# Patient Record
Sex: Male | Born: 1975 | Race: Black or African American | Hispanic: No | Marital: Married | Smoking: Current every day smoker
Health system: Southern US, Community
[De-identification: ages and names within clinical notes are randomized; demographics above are authoritative.]

## PROBLEM LIST (undated history)

## (undated) DIAGNOSIS — E7800 Pure hypercholesterolemia, unspecified: Secondary | ICD-10-CM

## (undated) HISTORY — PX: COLONOSCOPY: SHX174

## (undated) HISTORY — PX: OTHER SURGICAL HISTORY: SHX169

---

## 2004-03-20 ENCOUNTER — Emergency Department: Payer: Self-pay | Admitting: Emergency Medicine

## 2004-04-25 ENCOUNTER — Emergency Department: Payer: Self-pay | Admitting: Emergency Medicine

## 2004-05-11 ENCOUNTER — Emergency Department: Payer: Self-pay | Admitting: Emergency Medicine

## 2004-12-28 ENCOUNTER — Emergency Department: Payer: Self-pay | Admitting: Emergency Medicine

## 2005-08-27 ENCOUNTER — Emergency Department: Payer: Self-pay | Admitting: Emergency Medicine

## 2005-09-12 ENCOUNTER — Emergency Department: Payer: Self-pay | Admitting: Emergency Medicine

## 2006-08-23 ENCOUNTER — Emergency Department: Payer: Self-pay | Admitting: Emergency Medicine

## 2007-06-02 ENCOUNTER — Emergency Department: Payer: Self-pay | Admitting: Emergency Medicine

## 2009-01-02 ENCOUNTER — Emergency Department: Payer: Self-pay | Admitting: Emergency Medicine

## 2010-02-27 ENCOUNTER — Ambulatory Visit: Payer: Self-pay | Admitting: General Surgery

## 2010-03-12 ENCOUNTER — Ambulatory Visit: Payer: Self-pay | Admitting: Cardiovascular Disease

## 2010-03-13 ENCOUNTER — Ambulatory Visit: Payer: Self-pay | Admitting: General Surgery

## 2010-03-17 LAB — PATHOLOGY REPORT

## 2010-12-30 ENCOUNTER — Emergency Department: Payer: Self-pay

## 2011-05-13 ENCOUNTER — Ambulatory Visit: Payer: Self-pay

## 2011-06-16 ENCOUNTER — Ambulatory Visit: Payer: Self-pay

## 2011-11-12 ENCOUNTER — Emergency Department: Payer: Self-pay | Admitting: Unknown Physician Specialty

## 2011-11-12 LAB — URINALYSIS, COMPLETE
Bilirubin,UR: NEGATIVE
Glucose,UR: NEGATIVE mg/dL (ref 0–75)
Leukocyte Esterase: NEGATIVE
Ph: 6 (ref 4.5–8.0)
Protein: NEGATIVE
RBC,UR: 1 /HPF (ref 0–5)
Specific Gravity: 1.003 (ref 1.003–1.030)

## 2011-11-12 LAB — CBC
HGB: 13.7 g/dL (ref 13.0–18.0)
MCH: 30.8 pg (ref 26.0–34.0)
MCHC: 32.9 g/dL (ref 32.0–36.0)
MCV: 93 fL (ref 80–100)
RBC: 4.44 10*6/uL (ref 4.40–5.90)
RDW: 14.5 % (ref 11.5–14.5)
WBC: 5.1 10*3/uL (ref 3.8–10.6)

## 2011-11-12 LAB — COMPREHENSIVE METABOLIC PANEL
Albumin: 3.8 g/dL (ref 3.4–5.0)
Alkaline Phosphatase: 76 U/L (ref 50–136)
Anion Gap: 7 (ref 7–16)
BUN: 5 mg/dL — ABNORMAL LOW (ref 7–18)
Bilirubin,Total: 0.9 mg/dL (ref 0.2–1.0)
Calcium, Total: 9.1 mg/dL (ref 8.5–10.1)
Co2: 31 mmol/L (ref 21–32)
Glucose: 108 mg/dL — ABNORMAL HIGH (ref 65–99)
Osmolality: 279 (ref 275–301)
Potassium: 3.6 mmol/L (ref 3.5–5.1)
SGPT (ALT): 20 U/L
Total Protein: 8.1 g/dL (ref 6.4–8.2)

## 2011-11-12 LAB — LIPASE, BLOOD: Lipase: 136 U/L (ref 73–393)

## 2014-03-23 ENCOUNTER — Emergency Department: Payer: Self-pay | Admitting: Internal Medicine

## 2015-11-24 ENCOUNTER — Emergency Department: Payer: Self-pay

## 2015-11-24 ENCOUNTER — Emergency Department
Admission: EM | Admit: 2015-11-24 | Discharge: 2015-11-24 | Disposition: A | Discharge status: Home / Self Care | Payer: Self-pay | Attending: Emergency Medicine | Admitting: Emergency Medicine

## 2015-11-24 ENCOUNTER — Encounter: Payer: Self-pay | Admitting: Emergency Medicine

## 2015-11-24 DIAGNOSIS — X501XXA Overexertion from prolonged static or awkward postures, initial encounter: Secondary | ICD-10-CM | POA: Insufficient documentation

## 2015-11-24 DIAGNOSIS — Y9301 Activity, walking, marching and hiking: Secondary | ICD-10-CM | POA: Insufficient documentation

## 2015-11-24 DIAGNOSIS — Y92007 Garden or yard of unspecified non-institutional (private) residence as the place of occurrence of the external cause: Secondary | ICD-10-CM | POA: Insufficient documentation

## 2015-11-24 DIAGNOSIS — F17210 Nicotine dependence, cigarettes, uncomplicated: Secondary | ICD-10-CM | POA: Insufficient documentation

## 2015-11-24 DIAGNOSIS — Y999 Unspecified external cause status: Secondary | ICD-10-CM | POA: Insufficient documentation

## 2015-11-24 DIAGNOSIS — S93402A Sprain of unspecified ligament of left ankle, initial encounter: Secondary | ICD-10-CM | POA: Insufficient documentation

## 2015-11-24 DIAGNOSIS — M25562 Pain in left knee: Secondary | ICD-10-CM | POA: Insufficient documentation

## 2015-11-24 DIAGNOSIS — G8911 Acute pain due to trauma: Secondary | ICD-10-CM

## 2015-11-24 MED ORDER — NAPROXEN 500 MG PO TABS: 500.0000 mg | ORAL_TABLET | Freq: Two times a day (BID) | ORAL | Status: AC

## 2015-11-24 MED ORDER — TRAMADOL HCL 50 MG PO TABS: 50.0000 mg | ORAL_TABLET | Freq: Four times a day (QID) | ORAL | Status: DC | PRN

## 2015-11-24 MED ORDER — TRAMADOL HCL 50 MG PO TABS
50.0000 mg | ORAL_TABLET | Freq: Once | ORAL | Status: AC
  Administered 2015-11-24: 50 mg via ORAL
  Filled 2015-11-24: qty 1

## 2015-11-24 NOTE — ED Notes (Signed)
See triage note.

## 2015-11-24 NOTE — ED Provider Notes (Signed)
[facility] Emergency Department Provider Note ____________________________________________  Time seen: Approximately 8:09 PM  I have reviewed the triage vital signs and the nursing notes.   HISTORY  Chief Complaint Ankle Injury    HPI Cody Aguilar is a 40 y.o. male who presents to the emergency department for evaluation of left ankle pain. He states while walking in his yard yesterday, he stepped in a hole and twisted his ankle. He states that throughout the day the pain in the ankle has increased and he has noticed some pain in the knee as well. He reports a previous history of left ankle fracture many years ago. He has been taking Tylenol without relief.  History reviewed. No pertinent past medical history.  There are no active problems to display for this patient.   History reviewed. No pertinent past surgical history.  Current Outpatient Rx  Name  Route  Sig  Dispense  Refill  . naproxen (NAPROSYN) 500 MG tablet   Oral   Take 1 tablet (500 mg total) by mouth 2 (two) times daily with a meal.   60 tablet   0   . traMADol (ULTRAM) 50 MG tablet   Oral   Take 1 tablet (50 mg total) by mouth every 6 (six) hours as needed.   12 tablet   0     Allergies Review of patient's allergies indicates no known allergies.  No family history on file.  Social History Social History  Substance Use Topics  . Smoking status: Current Every Day Smoker -- 1.00 packs/day    Types: Cigarettes  . Smokeless tobacco: None  . Alcohol Use: No    Review of Systems Constitutional: No recent illness. Cardiovascular: Denies chest pain or palpitations. Respiratory: Denies shortness of breath. Musculoskeletal: Pain in Left knee and left ankle Skin: Negative for rash, wound, lesion. Neurological: Negative for focal weakness or numbness.  ____________________________________________   PHYSICAL EXAM:  VITAL SIGNS: ED Triage Vitals  Enc Vitals Group   BP 11/24/15 1954 139/97 mmHg     Pulse Rate 11/24/15 1954 66     Resp 11/24/15 1954 16     Temp 11/24/15 1954 98.8 F (37.1 C)     Temp Source 11/24/15 1954 Oral     SpO2 11/24/15 1954 98 %     Weight --      Height --      Head Cir --      Peak Flow --      Pain Score 11/24/15 1955 6     Pain Loc --      Pain Edu? --      Excl. in GC? --     Constitutional: Alert and oriented. Well appearing and in no acute distress. Eyes: Conjunctivae are normal. EOMI. Head: Atraumatic. Neck: No stridor.  Respiratory: Normal respiratory effort.   Musculoskeletal: ATFL pattern tenderness and edema of the left ankle. Tenderness over the proximal head of the tibia on palpation. No deformities noted. Neurologic:  Normal speech and language. No gross focal neurologic deficits are appreciated. Speech is normal. No gait instability. Skin:  Skin is warm, dry and intact. Atraumatic. Psychiatric: Mood and affect are normal. Speech and behavior are normal.  ____________________________________________   LABS (all labs ordered are listed, but only abnormal results are displayed)  Labs Reviewed - No data to display ____________________________________________  RADIOLOGY  Left ankle and left knee is negative for acute bony abnormality per radiology. ____________________________________________   PROCEDURES  Procedure(s) performed:  Ankle stirrup splint applied to the left side by ER tech. Patient was neurovascularly intact post-application.   ____________________________________________   INITIAL IMPRESSION / ASSESSMENT AND PLAN / ED COURSE  Pertinent labs & imaging results that were available during my care of the patient were reviewed by me and considered in my medical decision making (see chart for details).  Patient was encouraged to take the tramadol and Naprosyn as prescribed. He was encouraged to follow up with the orthopedist for symptoms that are not improving over the week. He  was encouraged to return to the emergency department for symptoms that change or worsen if he is unable to schedule an appointment. ____________________________________________   FINAL CLINICAL IMPRESSION(S) / ED DIAGNOSES  Final diagnoses:  Acute pain due to injury  Ankle sprain, left, initial encounter  Knee pain, acute, left       Chinita Pester, FNP 11/24/15 2134  Loleta Rose, MD 11/24/15 2313

## 2015-11-24 NOTE — ED Notes (Addendum)
Pt to ED via POV with c/o left ankle pain after injury yesterday. Pt states he stepped into a hole and rolled his ankle. No deformity or swelling noted at this time. Pt denies any other injuries.

## 2016-08-25 ENCOUNTER — Emergency Department
Admission: EM | Admit: 2016-08-25 | Discharge: 2016-08-25 | Disposition: A | Discharge status: Home / Self Care | Payer: Self-pay | Attending: Emergency Medicine | Admitting: Emergency Medicine

## 2016-08-25 ENCOUNTER — Encounter: Payer: Self-pay | Admitting: Emergency Medicine

## 2016-08-25 DIAGNOSIS — Z79899 Other long term (current) drug therapy: Secondary | ICD-10-CM | POA: Insufficient documentation

## 2016-08-25 DIAGNOSIS — J101 Influenza due to other identified influenza virus with other respiratory manifestations: Secondary | ICD-10-CM | POA: Insufficient documentation

## 2016-08-25 DIAGNOSIS — F17210 Nicotine dependence, cigarettes, uncomplicated: Secondary | ICD-10-CM | POA: Insufficient documentation

## 2016-08-25 MED ORDER — BENZONATATE 100 MG PO CAPS: 100.0000 mg | ORAL_CAPSULE | Freq: Three times a day (TID) | ORAL | 0 refills | Status: AC | PRN

## 2016-08-25 MED ORDER — OSELTAMIVIR PHOSPHATE 75 MG PO CAPS: 75.0000 mg | ORAL_CAPSULE | Freq: Two times a day (BID) | ORAL | 0 refills | Status: DC

## 2016-08-25 MED ORDER — OSELTAMIVIR PHOSPHATE 75 MG PO CAPS: 75.0000 mg | ORAL_CAPSULE | Freq: Two times a day (BID) | ORAL | 0 refills | Status: AC

## 2016-08-25 NOTE — ED Triage Notes (Signed)
Pt with cough and generalized body aches since Monday.

## 2016-08-25 NOTE — ED Notes (Signed)
See triage note, pt reports cough since Monday, denies production. Pt denies fever as well. Pt does report body aches and runny nose. Pt A&O and in NAD at this time.

## 2016-08-26 NOTE — ED Provider Notes (Signed)
[facility] Emergency Department Provider Note  ____________________________________________  Time seen: Approximately 6:17 PM  I have reviewed the triage vital signs and the nursing notes.   HISTORY  Chief Complaint Cough and Generalized Body Aches    HPI Cody Aguilar is a 41 y.o. male presenting to the emergency department with headache, congestion rhinorrhea, nonproductive cough and myalgias for the past 3 days. Patient has not evaluated his temperature. However, he has had chills. Patient is tolerating fluids by mouth. However, he has had a diminished appetite. Patient has also experienced increased sleep. No recent travel. Patient denies chest pain, chest tightness, shortness of breath, abdominal pain, nausea and vomiting.    History reviewed. No pertinent past medical history.  There are no active problems to display for this patient.   History reviewed. No pertinent surgical history.  Prior to Admission medications   Medication Sig Start Date End Date Taking? Authorizing Provider  benzonatate (TESSALON PERLES) 100 MG capsule Take 1 capsule (100 mg total) by mouth 3 (three) times daily as needed for cough. 08/25/16 09/01/16  Orvil Feil, PA-C  naproxen (NAPROSYN) 500 MG tablet Take 1 tablet (500 mg total) by mouth 2 (two) times daily with a meal. 11/24/15 11/23/16  Chinita Pester, FNP  oseltamivir (TAMIFLU) 75 MG capsule Take 1 capsule (75 mg total) by mouth 2 (two) times daily. 08/25/16 08/30/16  Orvil Feil, PA-C  traMADol (ULTRAM) 50 MG tablet Take 1 tablet (50 mg total) by mouth every 6 (six) hours as needed. 11/24/15   Chinita Pester, FNP    Allergies Patient has no known allergies.  No family history on file.  Social History Social History  Substance Use Topics  . Smoking status: Current Every Day Smoker    Packs/day: 1.00    Types: Cigarettes  . Smokeless tobacco: Never Used  . Alcohol use No    Review of Systems   Constitutional: Patient has had fever.  Eyes: No visual changes. No discharge ENT: Patient has had congestion.  Cardiovascular: no chest pain. Respiratory: Patient has had non-productive cough. No SOB. Gastrointestinal: Patient denies nausea, vomiting or diarrhea. Genitourinary: Negative for dysuria. No hematuria Musculoskeletal: Patient has had myalgias. Skin: Negative for rash, abrasions, lacerations, ecchymosis. Neurological: Patient has had headache, no focal weakness or numbness.   ____________________________________________   PHYSICAL EXAM:  VITAL SIGNS: ED Triage Vitals  Enc Vitals Group     BP 08/25/16 1843 (!) 147/93     Pulse Rate 08/25/16 1843 91     Resp 08/25/16 1843 20     Temp 08/25/16 1843 100.1 F (37.8 C)     Temp Source 08/25/16 1843 Oral     SpO2 08/25/16 1843 100 %     Weight 08/25/16 1844 195 lb (88.5 kg)     Height 08/25/16 1844 6\' 1"  (1.854 m)     Head Circumference --      Peak Flow --      Pain Score 08/25/16 1844 6     Pain Loc --      Pain Edu? --      Excl. in GC? --    Constitutional: Alert and oriented. Patient is lying supine in bed.  Eyes: Conjunctivae are normal. PERRL. EOMI. Head: Atraumatic. ENT:      Ears: Tympanic membranes are injected bilaterally without evidence of effusion or purulent exudate. Bony landmarks are visualized bilaterally. No pain with palpation at the tragus.      Nose: Nasal  turbinates are edematous and erythematous. Copious rhinorrhea visualized.      Mouth/Throat: Mucous membranes are moist. Posterior pharynx is mildly erythematous. No tonsillar hypertrophy or purulent exudate. Uvula is midline. Neck: Full range of motion. No pain is elicited with flexion at the neck. Hematological/Lymphatic/Immunilogical: No cervical lymphadenopathy. Cardiovascular: Normal rate, regular rhythm. Normal S1 and S2.  Good peripheral circulation. Respiratory: Normal respiratory effort without tachypnea or retractions. Lungs CTAB.  Good air entry to the bases with no decreased or absent breath sounds. Gastrointestinal: Bowel sounds 4 quadrants. Soft and nontender to palpation. No guarding or rigidity. No palpable masses. No distention. No CVA tenderness.  Skin:  Skin is warm, dry and intact. No rash noted. Psychiatric: Mood and affect are normal. Speech and behavior are normal. Patient exhibits appropriate insight and judgement.  ____________________________________________   LABS (all labs ordered are listed, but only abnormal results are displayed)  Labs Reviewed - No data to display ____________________________________________  EKG   ____________________________________________  RADIOLOGY  No results found.  ____________________________________________    PROCEDURES  Procedure(s) performed:    Procedures    Medications - No data to display   ____________________________________________   INITIAL IMPRESSION / ASSESSMENT AND PLAN / ED COURSE  Pertinent labs & imaging results that were available during my care of the patient were reviewed by me and considered in my medical decision making (see chart for details).  Review of the [place] CSRS was performed in accordance of the [facility] prior to dispensing any controlled drugs.     Assessment and Plan:  Influenza: Patient presents to the emergency department with headache, congestion rhinorrhea, nonproductive cough and myalgias. Symptoms are consistent with influenza. Tamiflu was prescribed at discharge. Rest and hydration were encouraged. Patient was advised to follow-up with his primary care provider in one week. Physical exam and vital signs are reassuring at this time. All patient questions were answered.  ____________________________________________  FINAL CLINICAL IMPRESSION(S) / ED DIAGNOSES  Final diagnoses:  Influenza A      NEW MEDICATIONS STARTED DURING THIS VISIT:  Discharge Medication List as of 08/25/2016  9:06 PM    START  taking these medications   Details  benzonatate (TESSALON PERLES) 100 MG capsule Take 1 capsule (100 mg total) by mouth 3 (three) times daily as needed for cough., Starting Wed 08/25/2016, Until Wed 09/01/2016, Print            This chart was dictated using voice recognition software/Dragon. Despite best efforts to proofread, errors can occur which can change the meaning. Any change was purely unintentional.    Orvil FeilJaclyn M Woods, PA-C 08/26/16 1823    Phineas SemenGraydon Goodman, MD 08/28/16 [id number]

## 2016-12-05 DIAGNOSIS — J019 Acute sinusitis, unspecified: Secondary | ICD-10-CM | POA: Diagnosis not present

## 2016-12-05 DIAGNOSIS — B9689 Other specified bacterial agents as the cause of diseases classified elsewhere: Secondary | ICD-10-CM | POA: Diagnosis not present

## 2016-12-05 DIAGNOSIS — R040 Epistaxis: Secondary | ICD-10-CM | POA: Diagnosis not present

## 2017-10-21 ENCOUNTER — Encounter: Payer: Self-pay | Admitting: Emergency Medicine

## 2017-10-21 ENCOUNTER — Emergency Department
Admission: EM | Admit: 2017-10-21 | Discharge: 2017-10-21 | Disposition: A | Discharge status: Home / Self Care | Payer: BLUE CROSS/BLUE SHIELD | Attending: Emergency Medicine | Admitting: Emergency Medicine

## 2017-10-21 ENCOUNTER — Emergency Department: Payer: BLUE CROSS/BLUE SHIELD

## 2017-10-21 DIAGNOSIS — R079 Chest pain, unspecified: Secondary | ICD-10-CM | POA: Diagnosis not present

## 2017-10-21 DIAGNOSIS — K2970 Gastritis, unspecified, without bleeding: Secondary | ICD-10-CM

## 2017-10-21 DIAGNOSIS — R0789 Other chest pain: Secondary | ICD-10-CM | POA: Diagnosis not present

## 2017-10-21 DIAGNOSIS — F17210 Nicotine dependence, cigarettes, uncomplicated: Secondary | ICD-10-CM | POA: Diagnosis not present

## 2017-10-21 DIAGNOSIS — R0602 Shortness of breath: Secondary | ICD-10-CM | POA: Diagnosis not present

## 2017-10-21 DIAGNOSIS — R110 Nausea: Secondary | ICD-10-CM | POA: Diagnosis not present

## 2017-10-21 HISTORY — DX: Pure hypercholesterolemia, unspecified: E78.00

## 2017-10-21 LAB — BASIC METABOLIC PANEL
ANION GAP: 6 (ref 5–15)
BUN: 7 mg/dL (ref 6–20)
CALCIUM: 9.3 mg/dL (ref 8.9–10.3)
CO2: 29 mmol/L (ref 22–32)
Chloride: 104 mmol/L (ref 101–111)
Creatinine, Ser: 1.24 mg/dL (ref 0.61–1.24)
GFR calc non Af Amer: >60 mL/min (ref >60)
GLUCOSE: 100 mg/dL — AB (ref 65–99)
POTASSIUM: 3.9 mmol/L (ref 3.5–5.1)
Sodium: 139 mmol/L (ref 135–145)

## 2017-10-21 LAB — TROPONIN I

## 2017-10-21 LAB — CBC
HEMATOCRIT: 40.8 % (ref 40.0–52.0)
Hemoglobin: 13.7 g/dL (ref 13.0–18.0)
MCH: 31 pg (ref 26.0–34.0)
MCHC: 33.6 g/dL (ref 32.0–36.0)
MCV: 92.3 fL (ref 80.0–100.0)
Platelets: 275 10*3/uL (ref 150–440)
RBC: 4.42 MIL/uL (ref 4.40–5.90)
RDW: 15.7 % — ABNORMAL HIGH (ref 11.5–14.5)
WBC: 6.4 10*3/uL (ref 3.8–10.6)

## 2017-10-21 MED ORDER — FAMOTIDINE 40 MG PO TABS: 40.0000 mg | ORAL_TABLET | Freq: Every evening | ORAL | 1 refills | Status: DC

## 2017-10-21 MED ORDER — SUCRALFATE 1 G PO TABS: 1.0000 g | ORAL_TABLET | Freq: Four times a day (QID) | ORAL | 0 refills | Status: DC

## 2017-10-21 MED ORDER — GI COCKTAIL ~~LOC~~
30.0000 mL | Freq: Once | ORAL | Status: AC
  Administered 2017-10-21: 30 mL via ORAL
  Filled 2017-10-21: qty 30

## 2017-10-21 NOTE — ED Provider Notes (Signed)
[facility] Emergency Department Provider Note   ____________________________________________   I have reviewed the triage vital signs and the nursing notes.   HISTORY  Chief Complaint Chest Pain   History limited by: Not Limited   HPI Cody Aguilar is a 42 y.o. male who presents to the emergency department today because of concerns for chest pain.  He states it has been going on for the past 4 to 5 months.  It is intermittent.  He has not noticed any pattern to the pain.  He comes in today because he had an especially bad episode yesterday.  Describes it as a sharp pain located in the center and left side of the chest.  He otherwise has some chest tightness.  It is accompanied by some shortness of breath.  He has had nausea with it.  He denies fevers.  Per medical record review patient has a history of high cholesterol.  Past Medical History:  Diagnosis Date  . High cholesterol     There are no active problems to display for this patient.   History reviewed. No pertinent surgical history.  Prior to Admission medications   Medication Sig Start Date End Date Taking? Authorizing Provider  traMADol (ULTRAM) 50 MG tablet Take 1 tablet (50 mg total) by mouth every 6 (six) hours as needed. 11/24/15   Chinita Pester, FNP    Allergies Patient has no known allergies.  No family history on file.  Social History Social History   Tobacco Use  . Smoking status: Current Every Day Smoker    Packs/day: 0.50    Types: Cigarettes  . Smokeless tobacco: Never Used  Substance Use Topics  . Alcohol use: No  . Drug use: No    Review of Systems Constitutional: No fever/chills Eyes: No visual changes. ENT: No sore throat. Cardiovascular: Positive for chest pain. Respiratory: Positive for shortness of breath. Gastrointestinal: No abdominal pain.  No nausea, no vomiting.  No diarrhea.   Genitourinary: Negative for dysuria. Musculoskeletal: Negative  for back pain. Skin: Negative for rash. Neurological: Negative for headaches, focal weakness or numbness.  ____________________________________________   PHYSICAL EXAM:  VITAL SIGNS: ED Triage Vitals  Enc Vitals Group     BP 10/21/17 1523 (!) 155/103     Pulse Rate 10/21/17 1523 67     Resp 10/21/17 1523 18     Temp 10/21/17 1523 98.6 F (37 C)     Temp Source 10/21/17 1523 Oral     SpO2 10/21/17 1523 99 %     Weight 10/21/17 1524 190 lb (86.2 kg)     Height 10/21/17 1524 6' (1.829 m)     Head Circumference --      Peak Flow --      Pain Score 10/21/17 1524 5    Constitutional: Alert and oriented. Well appearing and in no distress. Eyes: Conjunctivae are normal.  ENT   Head: Normocephalic and atraumatic.   Nose: No congestion/rhinnorhea.   Mouth/Throat: Mucous membranes are moist.   Neck: No stridor. Hematological/Lymphatic/Immunilogical: No cervical lymphadenopathy. Cardiovascular: Normal rate, regular rhythm.  No murmurs, rubs, or gallops.  Respiratory: Normal respiratory effort without tachypnea nor retractions. Breath sounds are clear and equal bilaterally. No wheezes/rales/rhonchi. Gastrointestinal: Soft and non tender. No rebound. No guarding.  Genitourinary: Deferred Musculoskeletal: Normal range of motion in all extremities. No lower extremity edema. Neurologic:  Normal speech and language. No gross focal neurologic deficits are appreciated.  Skin:  Skin is warm, dry and  intact. No rash noted. Psychiatric: Mood and affect are normal. Speech and behavior are normal. Patient exhibits appropriate insight and judgment.  ____________________________________________    LABS (pertinent positives/negatives)  BMP wnl except glu 100 CBC wbc 6.4, hgb 13.7, plt 275 Trop <0.03   ____________________________________________   EKG  I, Phineas Semen, attending physician, personally viewed and interpreted this EKG  EKG Time: 1519 Rate: 65 Rhythm:  normal sinus rhythm Axis: normal Intervals: qtc 403 QRS: narrow ST changes: no st elevation Impression: normal ekg   ____________________________________________    RADIOLOGY  CXR No acute findings  ____________________________________________   PROCEDURES  Procedures  ____________________________________________   INITIAL IMPRESSION / ASSESSMENT AND PLAN / ED COURSE  Pertinent labs & imaging results that were available during my care of the patient were reviewed by me and considered in my medical decision making (see chart for details).  Patient presented to the emergency department today because of concerns for chest pain.  Differential would be broad including pneumonia, pneumothorax, ACS, pericarditis, PE, dissection, esophagitis, gastritis, costochondritis amongst other etiologies.  Patient does appear quite comfortable.  Troponin negative.  Chest x-ray without concerning findings.  At this point I doubt PE or dissection.  He did feel better after GI cocktail.  I think esophagitis and gastritis likely.  Discussed this with the patient.  Will plan on giving patient prescription for antiacid and sulfate.   ____________________________________________   FINAL CLINICAL IMPRESSION(S) / ED DIAGNOSES  Final diagnoses:  Nonspecific chest pain  Gastritis, presence of bleeding unspecified, unspecified chronicity, unspecified gastritis type     Note: This dictation was prepared with Dragon dictation. Any transcriptional errors that result from this process are unintentional     Phineas Semen, MD 10/21/17 1727

## 2017-10-21 NOTE — Discharge Instructions (Addendum)
Please seek medical attention for any high fevers, chest pain, shortness of breath, change in behavior, persistent vomiting, bloody stool or any other new or concerning symptoms.  

## 2017-10-21 NOTE — ED Triage Notes (Signed)
Patient presents to the ED with chest tightness for the past 3-4 days.  Patient states yesterday he had an episode of sharp chest pain with shortness of breath and afterward developed a cough.  Patient states chest tightness today is constant but intensity changes.  Patient denies any sharp chest pain today and some shortness of breath.  Patient denies diaphoresis today.  Reports some nausea and dizziness.

## 2018-03-02 ENCOUNTER — Ambulatory Visit: Payer: BLUE CROSS/BLUE SHIELD | Admitting: Family Medicine

## 2018-03-02 ENCOUNTER — Other Ambulatory Visit: Payer: Self-pay

## 2018-03-02 ENCOUNTER — Encounter: Payer: Self-pay | Admitting: Family Medicine

## 2018-03-02 VITALS — BP 154/112 | HR 80 | Temp 98.7°F | Ht 72.0 in | Wt 193.0 lb

## 2018-03-02 DIAGNOSIS — R079 Chest pain, unspecified: Secondary | ICD-10-CM

## 2018-03-02 DIAGNOSIS — I10 Essential (primary) hypertension: Secondary | ICD-10-CM

## 2018-03-02 DIAGNOSIS — Z7689 Persons encountering health services in other specified circumstances: Secondary | ICD-10-CM

## 2018-03-02 MED ORDER — AMLODIPINE BESYLATE 5 MG PO TABS: 5.0000 mg | ORAL_TABLET | Freq: Every day | ORAL | 1 refills | Status: DC

## 2018-03-02 NOTE — Progress Notes (Signed)
BP (!) 154/112   Pulse 80   Temp 98.7 F (37.1 C) (Oral)   Ht 6' (1.829 m)   Wt 193 lb (87.5 kg)   SpO2 97%   BMI 26.18 kg/m    Subjective:    Patient ID: Cody Aguilar, male    DOB: 05-17-1976, 42 y.o.   MRN: [id number]  HPI: Cody Aguilar is a 42 y.o. male  Chief Complaint  Patient presents with  . New Patient (Initial Visit)    pt states routine check up   Here today to establish care. No known chronic medical problems and not currently taking any medications.    Has been told in the past that his BPs were high but never had a dx or medications for it. Notes random intermittent diffuse chest pains that often happen when sitting watching TV. No diaphoresis, HAs, syncope. Episodes resolve without treatment. Current smoker, no past hx of heart disease.   Relevant past medical, surgical, family and social history reviewed and updated as indicated. Interim medical history since our last visit reviewed. Allergies and medications reviewed and updated.  Review of Systems  Per HPI unless specifically indicated above     Objective:    BP (!) 154/112   Pulse 80   Temp 98.7 F (37.1 C) (Oral)   Ht 6' (1.829 m)   Wt 193 lb (87.5 kg)   SpO2 97%   BMI 26.18 kg/m   Wt Readings from Last 3 Encounters:  03/02/18 193 lb (87.5 kg)  10/21/17 190 lb (86.2 kg)  08/25/16 195 lb (88.5 kg)    Physical Exam  Constitutional: He is oriented to person, place, and time. He appears well-developed and well-nourished. No distress.  HENT:  Head: Atraumatic.  Eyes: EOM are normal.  Neck: Normal range of motion. Neck supple.  Cardiovascular: Normal rate and regular rhythm.  Pulmonary/Chest: Effort normal and breath sounds normal.  Musculoskeletal: Normal range of motion.  Neurological: He is alert and oriented to person, place, and time.  Skin: Skin is warm and dry.  Psychiatric: He has a normal mood and affect. His behavior is normal.  Nursing note and vitals  reviewed.   Results for orders placed or performed during the hospital encounter of 10/21/17  Basic metabolic panel  Result Value Ref Range   Sodium 139 135 - 145 mmol/L   Potassium 3.9 3.5 - 5.1 mmol/L   Chloride 104 101 - 111 mmol/L   CO2 29 22 - 32 mmol/L   Glucose, Bld 100 (H) 65 - 99 mg/dL   BUN 7 6 - 20 mg/dL   Creatinine, Ser 7.82 0.61 - 1.24 mg/dL   Calcium 9.3 8.9 - 95.6 mg/dL   GFR calc non Af Amer >60 >60 mL/min   GFR calc Af Amer >60 >60 mL/min   Anion gap 6 5 - 15  CBC  Result Value Ref Range   WBC 6.4 3.8 - 10.6 K/uL   RBC 4.42 4.40 - 5.90 MIL/uL   Hemoglobin 13.7 13.0 - 18.0 g/dL   HCT 21.3 08.6 - 57.8 %   MCV 92.3 80.0 - 100.0 fL   MCH 31.0 26.0 - 34.0 pg   MCHC 33.6 32.0 - 36.0 g/dL   RDW 46.9 (H) 62.9 - 52.8 %   Platelets 275 150 - 440 K/uL  Troponin I  Result Value Ref Range   Troponin I <0.03 <0.03 ng/mL      Assessment & Plan:   Problem List Items Addressed  This Visit      Cardiovascular and Mediastinum   Essential hypertension - Primary    Start 5 mg amlodipine and recheck in several weeks at CPE. DASH diet, exercise, stress reduction.       Relevant Medications   amLODipine (NORVASC) 5 MG tablet    Other Visit Diagnoses    Encounter to establish care       Chest pain, unspecified type       Declines EKG today, will include this with upcoming CPE. Start acid reducers in meantime to r/o GERD component, stretch well before lifting       Follow up plan: Return in about 4 weeks (around 03/30/2018) for CPE, EKG.

## 2018-03-02 NOTE — Patient Instructions (Addendum)
Typical target range for blood pressure is 120/80, can be 20-ish points higher or lower as long as you feel well  100/60s - 140/90 DASH Eating Plan DASH stands for "Dietary Approaches to Stop Hypertension." The DASH eating plan is a healthy eating plan that has been shown to reduce high blood pressure (hypertension). It may also reduce your risk for type 2 diabetes, heart disease, and stroke. The DASH eating plan may also help with weight loss. What are tips for following this plan? General guidelines  Avoid eating more than 2,300 mg (milligrams) of salt (sodium) a day. If you have hypertension, you may need to reduce your sodium intake to 1,500 mg a day.  Limit alcohol intake to no more than 1 drink a day for nonpregnant women and 2 drinks a day for men. One drink equals 12 oz of beer, 5 oz of wine, or 1 oz of hard liquor.  Work with your health care provider to maintain a healthy body weight or to lose weight. Ask what an ideal weight is for you.  Get at least 30 minutes of exercise that causes your heart to beat faster (aerobic exercise) most days of the week. Activities may include walking, swimming, or biking.  Work with your health care provider or diet and nutrition specialist (dietitian) to adjust your eating plan to your individual calorie needs. Reading food labels  Check food labels for the amount of sodium per serving. Choose foods with less than 5 percent of the Daily Value of sodium. Generally, foods with less than 300 mg of sodium per serving fit into this eating plan.  To find whole grains, look for the word "whole" as the first word in the ingredient list. Shopping  Buy products labeled as "low-sodium" or "no salt added."  Buy fresh foods. Avoid canned foods and premade or frozen meals. Cooking  Avoid adding salt when cooking. Use salt-free seasonings or herbs instead of table salt or sea salt. Check with your health care provider or pharmacist before using salt  substitutes.  Do not fry foods. Cook foods using healthy methods such as baking, boiling, grilling, and broiling instead.  Cook with heart-healthy oils, such as olive, canola, soybean, or sunflower oil. Meal planning   Eat a balanced diet that includes: ? 5 or more servings of fruits and vegetables each day. At each meal, try to fill half of your plate with fruits and vegetables. ? Up to 6-8 servings of whole grains each day. ? Less than 6 oz of lean meat, poultry, or fish each day. A 3-oz serving of meat is about the same size as a deck of cards. One egg equals 1 oz. ? 2 servings of low-fat dairy each day. ? A serving of nuts, seeds, or beans 5 times each week. ? Heart-healthy fats. Healthy fats called Omega-3 fatty acids are found in foods such as flaxseeds and coldwater fish, like sardines, salmon, and mackerel.  Limit how much you eat of the following: ? Canned or prepackaged foods. ? Food that is high in trans fat, such as fried foods. ? Food that is high in saturated fat, such as fatty meat. ? Sweets, desserts, sugary drinks, and other foods with added sugar. ? Full-fat dairy products.  Do not salt foods before eating.  Try to eat at least 2 vegetarian meals each week.  Eat more home-cooked food and less restaurant, buffet, and fast food.  When eating at a restaurant, ask that your food be prepared with less  salt or no salt, if possible. What foods are recommended? The items listed may not be a complete list. Talk with your dietitian about what dietary choices are best for you. Grains Whole-grain or whole-wheat bread. Whole-grain or whole-wheat pasta. Brown rice. [place]. Bulgur. Whole-grain and low-sodium cereals. Pita bread. Low-fat, low-sodium crackers. Whole-wheat flour tortillas. Vegetables Fresh or frozen vegetables (raw, steamed, roasted, or grilled). Low-sodium or reduced-sodium tomato and vegetable juice. Low-sodium or reduced-sodium tomato sauce and tomato  paste. Low-sodium or reduced-sodium canned vegetables. Fruits All fresh, dried, or frozen fruit. Canned fruit in natural juice (without added sugar). Meat and other protein foods Skinless chicken or [place]. Ground chicken or [place]. Pork with fat trimmed off. Fish and seafood. Egg whites. Dried beans, peas, or lentils. Unsalted nuts, nut butters, and seeds. Unsalted canned beans. Lean cuts of beef with fat trimmed off. Low-sodium, lean deli meat. Dairy Low-fat (1%) or fat-free (skim) milk. Fat-free, low-fat, or reduced-fat cheeses. Nonfat, low-sodium ricotta or cottage cheese. Low-fat or nonfat yogurt. Low-fat, low-sodium cheese. Fats and oils Soft margarine without trans fats. Vegetable oil. Low-fat, reduced-fat, or light mayonnaise and salad dressings (reduced-sodium). Canola, safflower, olive, soybean, and sunflower oils. Avocado. Seasoning and other foods Herbs. Spices. Seasoning mixes without salt. Unsalted popcorn and pretzels. Fat-free sweets. What foods are not recommended? The items listed may not be a complete list. Talk with your dietitian about what dietary choices are best for you. Grains Baked goods made with fat, such as croissants, muffins, or some breads. Dry pasta or rice meal packs. Vegetables Creamed or fried vegetables. Vegetables in a cheese sauce. Regular canned vegetables (not low-sodium or reduced-sodium). Regular canned tomato sauce and paste (not low-sodium or reduced-sodium). Regular tomato and vegetable juice (not low-sodium or reduced-sodium). Angie Fava. Olives. Fruits Canned fruit in a light or heavy syrup. Fried fruit. Fruit in cream or butter sauce. Meat and other protein foods Fatty cuts of meat. Ribs. Fried meat. Berniece Salines. Sausage. Bologna and other processed lunch meats. Salami. Fatback. Hotdogs. Bratwurst. Salted nuts and seeds. Canned beans with added salt. Canned or smoked fish. Whole eggs or egg yolks. Chicken or [place] with skin. Dairy Whole or 2% milk,  cream, and half-and-half. Whole or full-fat cream cheese. Whole-fat or sweetened yogurt. Full-fat cheese. Nondairy creamers. Whipped toppings. Processed cheese and cheese spreads. Fats and oils Butter. Stick margarine. Lard. Shortening. Ghee. Bacon fat. Tropical oils, such as coconut, palm kernel, or palm oil. Seasoning and other foods Salted popcorn and pretzels. Onion salt, garlic salt, seasoned salt, table salt, and sea salt. Worcestershire sauce. Tartar sauce. Barbecue sauce. Teriyaki sauce. Soy sauce, including reduced-sodium. Steak sauce. Canned and packaged gravies. Fish sauce. Oyster sauce. Cocktail sauce. Horseradish that you find on the shelf. Ketchup. Mustard. Meat flavorings and tenderizers. Bouillon cubes. Hot sauce and Tabasco sauce. Premade or packaged marinades. Premade or packaged taco seasonings. Relishes. Regular salad dressings. Where to find more information:  National Heart, Lung, and [place]: https://wilson-eaton.com/  American Heart Association: www.heart.org Summary  The DASH eating plan is a healthy eating plan that has been shown to reduce high blood pressure (hypertension). It may also reduce your risk for type 2 diabetes, heart disease, and stroke.  With the DASH eating plan, you should limit salt (sodium) intake to 2,300 mg a day. If you have hypertension, you may need to reduce your sodium intake to 1,500 mg a day.  When on the DASH eating plan, aim to eat more fresh fruits and vegetables, whole grains, lean proteins, low-fat  dairy, and heart-healthy fats.  Work with your health care provider or diet and nutrition specialist (dietitian) to adjust your eating plan to your individual calorie needs. This information is not intended to replace advice given to you by your health care provider. Make sure you discuss any questions you have with your health care provider. Document Released: 05/20/2011 Document Revised: 05/24/2016 Document Reviewed: 05/24/2016 Elsevier  Interactive Patient Education  [employer].

## 2018-03-05 NOTE — Assessment & Plan Note (Signed)
Start 5 mg amlodipine and recheck in several weeks at [facility]. DASH diet, exercise, stress reduction.

## 2018-03-30 ENCOUNTER — Other Ambulatory Visit: Payer: Self-pay

## 2018-03-30 ENCOUNTER — Encounter: Payer: Self-pay | Admitting: Family Medicine

## 2018-03-30 ENCOUNTER — Ambulatory Visit (INDEPENDENT_AMBULATORY_CARE_PROVIDER_SITE_OTHER): Payer: BLUE CROSS/BLUE SHIELD | Admitting: Family Medicine

## 2018-03-30 VITALS — BP 137/91 | HR 60 | Temp 98.4°F | Ht 72.0 in | Wt 194.5 lb

## 2018-03-30 DIAGNOSIS — R079 Chest pain, unspecified: Secondary | ICD-10-CM | POA: Diagnosis not present

## 2018-03-30 DIAGNOSIS — Z114 Encounter for screening for human immunodeficiency virus [HIV]: Secondary | ICD-10-CM | POA: Diagnosis not present

## 2018-03-30 DIAGNOSIS — Z0000 Encounter for general adult medical examination without abnormal findings: Secondary | ICD-10-CM

## 2018-03-30 DIAGNOSIS — Z23 Encounter for immunization: Secondary | ICD-10-CM

## 2018-03-30 DIAGNOSIS — I10 Essential (primary) hypertension: Secondary | ICD-10-CM | POA: Diagnosis not present

## 2018-03-30 DIAGNOSIS — R350 Frequency of micturition: Secondary | ICD-10-CM | POA: Diagnosis not present

## 2018-03-30 LAB — UA/M W/RFLX CULTURE, ROUTINE
BILIRUBIN UA: NEGATIVE
Glucose, UA: NEGATIVE
Ketones, UA: NEGATIVE
Leukocytes, UA: NEGATIVE
Nitrite, UA: NEGATIVE
PROTEIN UA: NEGATIVE
Specific Gravity, UA: 1.01 (ref 1.005–1.030)
UUROB: 0.2 mg/dL (ref 0.2–1.0)
pH, UA: 6 (ref 5.0–7.5)

## 2018-03-30 LAB — MICROSCOPIC EXAMINATION
BACTERIA UA: NONE SEEN
WBC UA: NONE SEEN /HPF (ref 0–5)

## 2018-03-30 MED ORDER — LOSARTAN POTASSIUM 100 MG PO TABS: 100.0000 mg | ORAL_TABLET | Freq: Every day | ORAL | 0 refills | Status: DC

## 2018-03-30 NOTE — Progress Notes (Signed)
BP (!) 137/91   Pulse 60   Temp 98.4 F (36.9 C) (Oral)   Ht 6' (1.829 m)   Wt 194 lb 8 oz (88.2 kg)   SpO2 99%   BMI 26.38 kg/m    Subjective:    Patient ID: Cody Aguilar, male    DOB: [birth year], 42 y.o.   MRN: [id number]  HPI: Cody Aguilar is a 42 y.o. male presenting on 03/30/2018 for comprehensive medical examination. Current medical complaints include:see below  Since starting the amlodipine, has periods intermittently throughout the day of sweats and lightheadedness. Unsure if it's the medicine or hydration as he knows he doesn't drink enough water and sweats a lot doing his job. BPs running around 140/90s at home when checked.   Has a hx of high cholesterol but has not been checked in years and never had any medicines for it.   Still having the intermittent diffuse chest pains, very worried about these. Tried diet modifications to see if reflux related with no improvement. Denies SOB, diaphoresis, N/V with these episodes and notes they are self-limiting.   Notes he urinates what he thinks is too frequently throughout the day and night. Drinks about 40 oz of water daily. No dysuria, discharge, concern for STIs, fhx of prostate cancer.   He currently lives with: Interim Problems from his last visit: no  Depression Screen done today and results listed below:  Depression screen [facility] 2/9 03/30/2018 03/02/2018  Decreased Interest 1 2  Down, Depressed, Hopeless 0 0  PHQ - 2 Score 1 2  Altered sleeping 0 1  Tired, decreased energy 1 2  Change in appetite 0 2  Feeling bad or failure about yourself  0 0  Trouble concentrating 0 0  Moving slowly or fidgety/restless 0 0  Suicidal thoughts 0 0  PHQ-9 Score 2 7   The patient does not have a history of falls. I did not complete a risk assessment for falls. A plan of care for falls was not documented.   Past Medical History:  Past Medical History:  Diagnosis Date  . High cholesterol     Surgical History:    Past Surgical History:  Procedure Laterality Date  . hemorrhoid removal      Medications:  No current outpatient medications on file prior to visit.   No current facility-administered medications on file prior to visit.     Allergies:  Allergies  Allergen Reactions  . Oxymetazoline Swelling    Pt. Reports when he has tried to use any OTC nasal spray his face swells    Social History:  Social History   Socioeconomic History  . Marital status: Married    Spouse name: Not on file  . Number of children: Not on file  . Years of education: Not on file  . Highest education level: Not on file  Occupational History  . Not on file  Social Needs  . Financial resource strain: Not on file  . Food insecurity:    Worry: Not on file    Inability: Not on file  . Transportation needs:    Medical: Not on file    Non-medical: Not on file  Tobacco Use  . Smoking status: Current Every Day Smoker    Packs/day: 0.50    Types: Cigarettes  . Smokeless tobacco: Never Used  Substance and Sexual Activity  . Alcohol use: No  . Drug use: No  . Sexual activity: Yes  Lifestyle  . Physical activity:  Days per week: Not on file    Minutes per session: Not on file  . Stress: Not on file  Relationships  . Social connections:    Talks on phone: Not on file    Gets together: Not on file    Attends religious service: Not on file    Active member of club or organization: Not on file    Attends meetings of clubs or organizations: Not on file    Relationship status: Not on file  . Intimate partner violence:    Fear of current or ex partner: Not on file    Emotionally abused: Not on file    Physically abused: Not on file    Forced sexual activity: Not on file  Other Topics Concern  . Not on file  Social History Narrative  . Not on file   Social History   Tobacco Use  Smoking Status Current Every Day Smoker  . Packs/day: 0.50  . Types: Cigarettes  Smokeless Tobacco Never Used    Social History   Substance and Sexual Activity  Alcohol Use No    Family History:  Family History  Problem Relation Age of Onset  . Hypertension Mother   . Diabetes Mother   . Heart disease Mother   . Multiple sclerosis Mother   . Hypertension Father   . Hypertension Brother   . Heart disease Maternal Grandmother   . Heart disease Maternal Grandfather     Past medical history, surgical history, medications, allergies, family history and social history reviewed with patient today and changes made to appropriate areas of the chart.   Review of Systems - General ROS: negative Psychological ROS: negative Ophthalmic ROS: negative ENT ROS: negative Allergy and Immunology ROS: negative Hematological and Lymphatic ROS: negative Endocrine ROS: negative Breast ROS: negative for breast lumps Respiratory ROS: no cough, shortness of breath, or wheezing Cardiovascular ROS: positive for - chest pain Gastrointestinal ROS: no abdominal pain, change in bowel habits, or black or bloody stools Genito-Urinary ROS: no dysuria, trouble voiding, or hematuria Musculoskeletal ROS: negative Neurological ROS: no TIA or stroke symptoms Dermatological ROS: negative All other ROS negative except what is listed above and in the HPI.      Objective:    BP (!) 137/91   Pulse 60   Temp 98.4 F (36.9 C) (Oral)   Ht 6' (1.829 m)   Wt 194 lb 8 oz (88.2 kg)   SpO2 99%   BMI 26.38 kg/m   Wt Readings from Last 3 Encounters:  03/30/18 194 lb 8 oz (88.2 kg)  03/02/18 193 lb (87.5 kg)  10/21/17 190 lb (86.2 kg)    Physical Exam  Constitutional: He is oriented to person, place, and time. He appears well-developed and well-nourished. No distress.  HENT:  Head: Atraumatic.  Right Ear: External ear normal.  Left Ear: External ear normal.  Nose: Nose normal.  Mouth/Throat: Oropharynx is clear and moist.  Eyes: Pupils are equal, round, and reactive to light. Conjunctivae are normal. No scleral  icterus.  Neck: Normal range of motion. Neck supple.  Cardiovascular: Normal rate, regular rhythm, normal heart sounds and intact distal pulses.  No murmur heard. Pulmonary/Chest: Effort normal and breath sounds normal. No respiratory distress.  Abdominal: Soft. Bowel sounds are normal. He exhibits no distension and no mass. There is no tenderness. There is no guarding.  Genitourinary:  Genitourinary Comments: GU exam declined  Musculoskeletal: Normal range of motion. He exhibits no edema or tenderness.  Neurological: He  is alert and oriented to person, place, and time. He has normal reflexes.  Skin: Skin is warm and dry. No rash noted.  Psychiatric: He has a normal mood and affect. His behavior is normal.  Nursing note and vitals reviewed.  Results for orders placed or performed in visit on 03/30/18  Microscopic Examination  Result Value Ref Range   WBC, UA None seen 0 - 5 /hpf   RBC, UA 0-2 0 - 2 /hpf   Epithelial Cells (non renal) 0-10 0 - 10 /hpf   Bacteria, UA None seen None seen/Few  HIV Antibody (routine testing w rflx)  Result Value Ref Range   HIV Screen 4th Generation wRfx Non Reactive Non Reactive  CBC with Differential/Platelet  Result Value Ref Range   WBC 4.9 3.4 - 10.8 x10E3/uL   RBC 4.40 4.14 - 5.80 x10E6/uL   Hemoglobin 12.7 (L) 13.0 - 17.7 g/dL   Hematocrit 16.1 09.6 - 51.0 %   MCV 90 79 - 97 fL   MCH 28.9 26.6 - 33.0 pg   MCHC 32.2 31.5 - 35.7 g/dL   RDW 04.5 40.9 - 81.1 %   Platelets 283 150 - 450 x10E3/uL   Neutrophils 37 Not Estab. %   Lymphs 54 Not Estab. %   Monocytes 7 Not Estab. %   Eos 1 Not Estab. %   Basos 1 Not Estab. %   Neutrophils Absolute 1.8 1.4 - 7.0 x10E3/uL   Lymphocytes Absolute 2.6 0.7 - 3.1 x10E3/uL   Monocytes Absolute 0.3 0.1 - 0.9 x10E3/uL   EOS (ABSOLUTE) 0.1 0.0 - 0.4 x10E3/uL   Basophils Absolute 0.1 0.0 - 0.2 x10E3/uL   Immature Granulocytes 0 Not Estab. %   Immature Grans (Abs) 0.0 0.0 - 0.1 x10E3/uL   Hematology  Comments: Note:   Comprehensive metabolic panel  Result Value Ref Range   Glucose 80 65 - 99 mg/dL   BUN 6 6 - 24 mg/dL   Creatinine, Ser 9.14 0.76 - 1.27 mg/dL   GFR calc non Af Amer 72 >59 mL/min/1.73   GFR calc Af Amer 84 >59 mL/min/1.73   BUN/Creatinine Ratio 5 (L) 9 - 20   Sodium 142 134 - 144 mmol/L   Potassium 4.3 3.5 - 5.2 mmol/L   Chloride 102 96 - 106 mmol/L   CO2 26 20 - 29 mmol/L   Calcium 9.6 8.7 - 10.2 mg/dL   Total Protein 7.2 6.0 - 8.5 g/dL   Albumin 4.3 3.5 - 5.5 g/dL   Globulin, Total 2.9 1.5 - 4.5 g/dL   Albumin/Globulin Ratio 1.5 1.2 - 2.2   Bilirubin Total 0.2 0.0 - 1.2 mg/dL   Alkaline Phosphatase 73 39 - 117 IU/L   AST 17 0 - 40 IU/L   ALT 18 0 - 44 IU/L  Lipid Panel w/o Chol/HDL Ratio  Result Value Ref Range   Cholesterol, Total 183 100 - 199 mg/dL   Triglycerides 782 (H) 0 - 149 mg/dL   HDL 30 (L) >95 mg/dL   VLDL Cholesterol Cal 31 5 - 40 mg/dL   LDL Calculated 621 (H) 0 - 99 mg/dL  TSH  Result Value Ref Range   TSH 0.869 0.450 - 4.500 uIU/mL  UA/M w/rflx Culture, Routine  Result Value Ref Range   Specific Gravity, UA 1.010 1.005 - 1.030   pH, UA 6.0 5.0 - 7.5   Color, UA Yellow Yellow   Appearance Ur Clear Clear   Leukocytes, UA Negative Negative   Protein, UA Negative  Negative/Trace   Glucose, UA Negative Negative   Ketones, UA Negative Negative   RBC, UA Trace (A) Negative   Bilirubin, UA Negative Negative   Urobilinogen, Ur 0.2 0.2 - 1.0 mg/dL   Nitrite, UA Negative Negative   Microscopic Examination See below:   PSA  Result Value Ref Range   Prostate Specific Ag, Serum 1.8 0.0 - 4.0 ng/mL      Assessment & Plan:   Problem List Items Addressed This Visit      Cardiovascular and Mediastinum   Essential hypertension    Will d/c amlodipine as it may be causing some side effects for him. Start losartan 100 mg and continue home monitoring. F/u in 1 month for BP recheck.       Relevant Medications   losartan (COZAAR) 100 MG tablet     Other Visit Diagnoses    Chest pain, unspecified type    -  Primary   Referral placed to Cardiology, EKG today without acute ST or T wave changes but given persistent sxs and risk factors of smoking, high chol., fhx will refer   Relevant Orders   EKG 12-Lead (Completed)   Ambulatory referral to Cardiology   Annual physical exam       Relevant Orders   CBC with Differential/Platelet (Completed)   Comprehensive metabolic panel (Completed)   Lipid Panel w/o Chol/HDL Ratio (Completed)   TSH (Completed)   UA/M w/rflx Culture, Routine (Completed)   Encounter for screening for HIV       Relevant Orders   HIV Antibody (routine testing w rflx) (Completed)   Need for Tdap vaccination       Relevant Orders   Tdap vaccine greater than or equal to 7yo IM (Completed)   Urinary frequency       Relevant Orders   PSA (Completed)       Discussed aspirin prophylaxis for myocardial infarction prevention.  LABORATORY TESTING:  Health maintenance labs ordered today as discussed above.   The natural history of prostate cancer and ongoing controversy regarding screening and potential treatment outcomes of prostate cancer has been discussed with the patient. The meaning of a false positive PSA and a false negative PSA has been discussed. He indicates understanding of the limitations of this screening test and wishes to proceed with screening PSA testing.   IMMUNIZATIONS:   - Tdap: Tetanus vaccination status reviewed: Td vaccination indicated and given today. - Influenza: Refused  PATIENT COUNSELING:    Sexuality: Discussed sexually transmitted diseases, partner selection, use of condoms, avoidance of unintended pregnancy  and contraceptive alternatives.   Advised to avoid cigarette smoking.  I discussed with the patient that most people either abstain from alcohol or drink within safe limits (<=14/week and <=4 drinks/occasion for males, <=7/weeks and <= 3 drinks/occasion for females) and that  the risk for alcohol disorders and other health effects rises proportionally with the number of drinks per week and how often a drinker exceeds daily limits.  Discussed cessation/primary prevention of drug use and availability of treatment for abuse.   Diet: Encouraged to adjust caloric intake to maintain  or achieve ideal body weight, to reduce intake of dietary saturated fat and total fat, to limit sodium intake by avoiding high sodium foods and not adding table salt, and to maintain adequate dietary potassium and calcium preferably from fresh fruits, vegetables, and low-fat dairy products.    stressed the importance of regular exercise  Injury prevention: Discussed safety belts, safety helmets, smoke detector, smoking near  bedding or upholstery.   Dental health: Discussed importance of regular tooth brushing, flossing, and dental visits.   Follow up plan: NEXT PREVENTATIVE PHYSICAL DUE IN 1 YEAR. Return in about 4 weeks (around 04/27/2018) for BP recheck.

## 2018-03-30 NOTE — Patient Instructions (Signed)
Tdap Vaccine (Tetanus, Diphtheria and Pertussis): What You Need to Know 1. Why get vaccinated? Tetanus, diphtheria and pertussis are very serious diseases. Tdap vaccine can protect us from these diseases. And, Tdap vaccine given to pregnant women can protect newborn babies against pertussis. TETANUS (Lockjaw) is rare in the [place] today. It causes painful muscle tightening and stiffness, usually all over the body.  It can lead to tightening of muscles in the head and neck so you can't open your mouth, swallow, or sometimes even breathe. Tetanus kills about 1 out of 10 people who are infected even after receiving the best medical care.  DIPHTHERIA is also rare in the [place] today. It can cause a thick coating to form in the back of the throat.  It can lead to breathing problems, heart failure, paralysis, and death.  PERTUSSIS (Whooping Cough) causes severe coughing spells, which can cause difficulty breathing, vomiting and disturbed sleep.  It can also lead to weight loss, incontinence, and rib fractures. Up to 2 in 100 adolescents and 5 in 100 adults with pertussis are hospitalized or have complications, which could include pneumonia or death.  These diseases are caused by bacteria. Diphtheria and pertussis are spread from person to person through secretions from coughing or sneezing. Tetanus enters the body through cuts, scratches, or wounds. Before vaccines, as many as 200,000 cases of diphtheria, 200,000 cases of pertussis, and hundreds of cases of tetanus, were reported in the [place] each year. Since vaccination began, reports of cases for tetanus and diphtheria have dropped by about 99% and for pertussis by about 80%. 2. Tdap vaccine Tdap vaccine can protect adolescents and adults from tetanus, diphtheria, and pertussis. One dose of Tdap is routinely given at age 11 or 12. People who did not get Tdap at that age should get it as soon as possible. Tdap is especially  important for healthcare professionals and anyone having close contact with a baby younger than 12 months. Pregnant women should get a dose of Tdap during every pregnancy, to protect the newborn from pertussis. Infants are most at risk for severe, life-threatening complications from pertussis. Another vaccine, called Td, protects against tetanus and diphtheria, but not pertussis. A Td booster should be given every 10 years. Tdap may be given as one of these boosters if you have never gotten Tdap before. Tdap may also be given after a severe cut or burn to prevent tetanus infection. Your doctor or the person giving you the vaccine can give you more information. Tdap may safely be given at the same time as other vaccines. 3. Some people should not get this vaccine  A person who has ever had a life-threatening allergic reaction after a previous dose of any diphtheria, tetanus or pertussis containing vaccine, OR has a severe allergy to any part of this vaccine, should not get Tdap vaccine. Tell the person giving the vaccine about any severe allergies.  Anyone who had coma or long repeated seizures within 7 days after a childhood dose of DTP or DTaP, or a previous dose of Tdap, should not get Tdap, unless a cause other than the vaccine was found. They can still get Td.  Talk to your doctor if you: ? have seizures or another nervous system problem, ? had severe pain or swelling after any vaccine containing diphtheria, tetanus or pertussis, ? ever had a condition called Guillain-Barr Syndrome (GBS), ? aren't feeling well on the day the shot is scheduled. 4. Risks With any medicine, including   vaccines, there is a chance of side effects. These are usually mild and go away on their own. Serious reactions are also possible but are rare. Most people who get Tdap vaccine do not have any problems with it. Mild problems following Tdap: (Did not interfere with activities)  Pain where the shot was given (about  3 in 4 adolescents or 2 in 3 adults)  Redness or swelling where the shot was given (about 1 person in 5)  Mild fever of at least 100.4F (up to about 1 in 25 adolescents or 1 in 100 adults)  Headache (about 3 or 4 people in 10)  Tiredness (about 1 person in 3 or 4)  Nausea, vomiting, diarrhea, stomach ache (up to 1 in 4 adolescents or 1 in 10 adults)  Chills, sore joints (about 1 person in 10)  Body aches (about 1 person in 3 or 4)  Rash, swollen glands (uncommon)  Moderate problems following Tdap: (Interfered with activities, but did not require medical attention)  Pain where the shot was given (up to 1 in 5 or 6)  Redness or swelling where the shot was given (up to about 1 in 16 adolescents or 1 in 12 adults)  Fever over 102F (about 1 in 100 adolescents or 1 in 250 adults)  Headache (about 1 in 7 adolescents or 1 in 10 adults)  Nausea, vomiting, diarrhea, stomach ache (up to 1 or 3 people in 100)  Swelling of the entire arm where the shot was given (up to about 1 in 500).  Severe problems following Tdap: (Unable to perform usual activities; required medical attention)  Swelling, severe pain, bleeding and redness in the arm where the shot was given (rare).  Problems that could happen after any vaccine:  People sometimes faint after a medical procedure, including vaccination. Sitting or lying down for about 15 minutes can help prevent fainting, and injuries caused by a fall. Tell your doctor if you feel dizzy, or have vision changes or ringing in the ears.  Some people get severe pain in the shoulder and have difficulty moving the arm where a shot was given. This happens very rarely.  Any medication can cause a severe allergic reaction. Such reactions from a vaccine are very rare, estimated at fewer than 1 in a million doses, and would happen within a few minutes to a few hours after the vaccination. As with any medicine, there is a very remote chance of a vaccine  causing a serious injury or death. The safety of vaccines is always being monitored. For more information, visit: www.cdc.gov/vaccinesafety/ 5. What if there is a serious problem? What should I look for? Look for anything that concerns you, such as signs of a severe allergic reaction, very high fever, or unusual behavior. Signs of a severe allergic reaction can include hives, swelling of the face and throat, difficulty breathing, a fast heartbeat, dizziness, and weakness. These would usually start a few minutes to a few hours after the vaccination. What should I do?  If you think it is a severe allergic reaction or other emergency that can't wait, call 9-1-1 or get the person to the nearest hospital. Otherwise, call your doctor.  Afterward, the reaction should be reported to the Vaccine Adverse Event Reporting System (VAERS). Your doctor might file this report, or you can do it yourself through the VAERS web site at www.vaers.hhs.gov, or by calling 1-800-822-7967. ? VAERS does not give medical advice. 6. The National Vaccine Injury Compensation Program The National   Vaccine Injury Compensation Program (VICP) is a federal program that was created to compensate people who may have been injured by certain vaccines. Persons who believe they may have been injured by a vaccine can learn about the program and about filing a claim by calling 1-800-338-2382 or visiting the VICP website at www.hrsa.gov/vaccinecompensation. There is a time limit to file a claim for compensation. 7. How can I learn more?  Ask your doctor. He or she can give you the vaccine package insert or suggest other sources of information.  Call your local or state health department.  Contact the Centers for Disease Control and Prevention (CDC): ? Call 1-800-232-4636 (1-800-CDC-INFO) or ? Visit CDC's website at www.cdc.gov/vaccines CDC Tdap Vaccine VIS (08/07/13) This information is not intended to replace advice given to you by your  health care provider. Make sure you discuss any questions you have with your health care provider. Document Released: 11/30/2011 Document Revised: 02/19/2016 Document Reviewed: 02/19/2016 Elsevier Interactive Patient Education  2017 Elsevier Inc.  

## 2018-03-31 ENCOUNTER — Encounter: Payer: Self-pay | Admitting: Family Medicine

## 2018-03-31 LAB — CBC WITH DIFFERENTIAL/PLATELET
BASOS ABS: 0.1 10*3/uL (ref 0.0–0.2)
Basos: 1 %
EOS (ABSOLUTE): 0.1 10*3/uL (ref 0.0–0.4)
Eos: 1 %
HEMATOCRIT: 39.4 % (ref 37.5–51.0)
Hemoglobin: 12.7 g/dL — ABNORMAL LOW (ref 13.0–17.7)
Immature Grans (Abs): 0 10*3/uL (ref 0.0–0.1)
Immature Granulocytes: 0 %
LYMPHS ABS: 2.6 10*3/uL (ref 0.7–3.1)
Lymphs: 54 %
MCH: 28.9 pg (ref 26.6–33.0)
MCHC: 32.2 g/dL (ref 31.5–35.7)
MCV: 90 fL (ref 79–97)
Monocytes Absolute: 0.3 10*3/uL (ref 0.1–0.9)
Monocytes: 7 %
NEUTROS ABS: 1.8 10*3/uL (ref 1.4–7.0)
Neutrophils: 37 %
Platelets: 283 10*3/uL (ref 150–450)
RBC: 4.4 x10E6/uL (ref 4.14–5.80)
RDW: 14.4 % (ref 12.3–15.4)
WBC: 4.9 10*3/uL (ref 3.4–10.8)

## 2018-03-31 LAB — COMPREHENSIVE METABOLIC PANEL
A/G RATIO: 1.5 (ref 1.2–2.2)
ALK PHOS: 73 IU/L (ref 39–117)
ALT: 18 IU/L (ref 0–44)
AST: 17 IU/L (ref 0–40)
Albumin: 4.3 g/dL (ref 3.5–5.5)
BUN/Creatinine Ratio: 5 — ABNORMAL LOW (ref 9–20)
BUN: 6 mg/dL (ref 6–24)
Bilirubin Total: 0.2 mg/dL (ref 0.0–1.2)
CO2: 26 mmol/L (ref 20–29)
Calcium: 9.6 mg/dL (ref 8.7–10.2)
Chloride: 102 mmol/L (ref 96–106)
Creatinine, Ser: 1.24 mg/dL (ref 0.76–1.27)
GFR calc Af Amer: 84 mL/min/{1.73_m2} (ref >59)
GFR calc non Af Amer: 72 mL/min/{1.73_m2} (ref >59)
GLOBULIN, TOTAL: 2.9 g/dL (ref 1.5–4.5)
Glucose: 80 mg/dL (ref 65–99)
POTASSIUM: 4.3 mmol/L (ref 3.5–5.2)
SODIUM: 142 mmol/L (ref 134–144)
Total Protein: 7.2 g/dL (ref 6.0–8.5)

## 2018-03-31 LAB — LIPID PANEL W/O CHOL/HDL RATIO
CHOLESTEROL TOTAL: 183 mg/dL (ref 100–199)
HDL: 30 mg/dL — ABNORMAL LOW (ref >39)
LDL Calculated: 122 mg/dL — ABNORMAL HIGH (ref 0–99)
TRIGLYCERIDES: 156 mg/dL — AB (ref 0–149)
VLDL Cholesterol Cal: 31 mg/dL (ref 5–40)

## 2018-03-31 LAB — TSH: TSH: 0.869 u[IU]/mL (ref 0.450–4.500)

## 2018-03-31 LAB — PSA: PROSTATE SPECIFIC AG, SERUM: 1.8 ng/mL (ref 0.0–4.0)

## 2018-03-31 LAB — HIV ANTIBODY (ROUTINE TESTING W REFLEX): HIV SCREEN 4TH GENERATION: NONREACTIVE

## 2018-03-31 NOTE — Assessment & Plan Note (Signed)
Will d/c amlodipine as it may be causing some side effects for him. Start losartan 100 mg and continue home monitoring. F/u in 1 month for BP recheck.

## 2018-04-22 ENCOUNTER — Other Ambulatory Visit: Payer: Self-pay | Admitting: Family Medicine

## 2018-04-24 NOTE — Telephone Encounter (Signed)
Next office visit 04/27/18. Requested Prescriptions  Pending Prescriptions Disp Refills  . losartan (COZAAR) 100 MG tablet [Pharmacy Med Name: LOSARTAN POTASSIUM 100 MG TAB] 90 tablet 0    Sig: TAKE 1 TABLET BY MOUTH EVERY DAY     Cardiovascular:  Angiotensin Receptor Blockers Failed - 04/22/2018 12:59 AM      Failed - Last BP in normal range    BP Readings from Last 1 Encounters:  03/30/18 (!) 137/91         Passed - Cr in normal range and within 180 days    Creatinine  Date Value Ref Range Status  11/12/2011 1.28 0.60 - 1.30 mg/dL Final   Creatinine, Ser  Date Value Ref Range Status  03/30/2018 1.24 0.76 - 1.27 mg/dL Final         Passed - K in normal range and within 180 days    Potassium  Date Value Ref Range Status  03/30/2018 4.3 3.5 - 5.2 mmol/L Final  11/12/2011 3.6 3.5 - 5.1 mmol/L Final         Passed - Patient is not pregnant      Passed - Valid encounter within last 6 months    Recent Outpatient Visits          3 weeks ago Chest pain, unspecified type   [facility] Particia Nearing, PA-C   1 month ago Essential hypertension   [facility] Particia Nearing, [place]      Future Appointments            In 3 days Maurice March, Salley Hews, PA-C [facility]

## 2018-04-25 ENCOUNTER — Other Ambulatory Visit: Payer: Self-pay | Admitting: Family Medicine

## 2018-04-27 ENCOUNTER — Ambulatory Visit: Payer: BLUE CROSS/BLUE SHIELD | Admitting: Family Medicine

## 2018-05-18 ENCOUNTER — Ambulatory Visit: Payer: BLUE CROSS/BLUE SHIELD | Admitting: Family Medicine

## 2018-06-29 ENCOUNTER — Other Ambulatory Visit: Payer: Self-pay

## 2018-06-29 ENCOUNTER — Encounter: Payer: Self-pay | Admitting: Family Medicine

## 2018-06-29 ENCOUNTER — Ambulatory Visit (INDEPENDENT_AMBULATORY_CARE_PROVIDER_SITE_OTHER): Payer: BLUE CROSS/BLUE SHIELD | Admitting: Family Medicine

## 2018-06-29 VITALS — BP 145/100 | HR 73 | Temp 98.3°F | Ht 72.0 in | Wt 201.0 lb

## 2018-06-29 DIAGNOSIS — I10 Essential (primary) hypertension: Secondary | ICD-10-CM

## 2018-06-29 DIAGNOSIS — G4700 Insomnia, unspecified: Secondary | ICD-10-CM | POA: Diagnosis not present

## 2018-06-29 MED ORDER — LOSARTAN POTASSIUM 100 MG PO TABS: 100.0000 mg | ORAL_TABLET | Freq: Every day | ORAL | 0 refills | Status: DC

## 2018-06-29 MED ORDER — AMLODIPINE BESYLATE 5 MG PO TABS: 5.0000 mg | ORAL_TABLET | Freq: Every day | ORAL | 0 refills | Status: DC

## 2018-06-29 MED ORDER — TRAZODONE HCL 50 MG PO TABS: 25.0000 mg | ORAL_TABLET | Freq: Every evening | ORAL | 0 refills | Status: DC | PRN

## 2018-06-29 NOTE — Assessment & Plan Note (Signed)
Discussed importance of medication compliance, continue losartan and add 5 mg amlodipine. Continue home monitoring, work on [employer] and exercise. F/u in 1 month. Call with persistent abnormal readings in meantime. Check bmp today

## 2018-06-29 NOTE — Progress Notes (Signed)
BP (!) 145/100   Pulse 73   Temp 98.3 F (36.8 C) (Oral)   Ht 6' (1.829 m)   Wt 201 lb (91.2 kg)   SpO2 96%   BMI 27.26 kg/m    Subjective:    Patient ID: Cody Aguilar, male    DOB: 07-20-1975, 43 y.o.   MRN: [id number]  HPI: Cody Aguilar is a 43 y.o. male  Chief Complaint  Patient presents with  . Hypertension    93m f/u   Here today for 1 month BP f/u after adding losartan 100 mg. Notes poor compliance overall, forgets more days than not but has taken his medicine 5/7 days the past week. 140s150s/90s-100 when checking at home. Denies side effects, CP, SOB, dizziness, HAs. Has not been trying DASH diet, exercises occasionally.   Trouble sleeping for about a month now. Has happened in the past intermittently. No noticeable pattern, new stressors, routine changes. Has tried OTC remedies but they don't seem to keep him asleep. Sometimes only getting 2-3 hours per night. No waking up gasping for air, snoring, vivid dreams, anxiety episodes.   Past Medical History:  Diagnosis Date  . High cholesterol    Social History   Socioeconomic History  . Marital status: Married    Spouse name: Not on file  . Number of children: Not on file  . Years of education: Not on file  . Highest education level: Not on file  Occupational History  . Not on file  Social Needs  . Financial resource strain: Not on file  . Food insecurity:    Worry: Not on file    Inability: Not on file  . Transportation needs:    Medical: Not on file    Non-medical: Not on file  Tobacco Use  . Smoking status: Current Every Day Smoker    Packs/day: 0.50    Types: Cigarettes  . Smokeless tobacco: Never Used  Substance and Sexual Activity  . Alcohol use: No  . Drug use: No  . Sexual activity: Yes  Lifestyle  . Physical activity:    Days per week: Not on file    Minutes per session: Not on file  . Stress: Not on file  Relationships  . Social connections:    Talks on phone: Not on file    Gets together: Not on file    Attends religious service: Not on file    Active member of club or organization: Not on file    Attends meetings of clubs or organizations: Not on file    Relationship status: Not on file  . Intimate partner violence:    Fear of current or ex partner: Not on file    Emotionally abused: Not on file    Physically abused: Not on file    Forced sexual activity: Not on file  Other Topics Concern  . Not on file  Social History Narrative  . Not on file    Relevant past medical, surgical, family and social history reviewed and updated as indicated. Interim medical history since our last visit reviewed. Allergies and medications reviewed and updated.  Review of Systems  Per HPI unless specifically indicated above     Objective:    BP (!) 145/100   Pulse 73   Temp 98.3 F (36.8 C) (Oral)   Ht 6' (1.829 m)   Wt 201 lb (91.2 kg)   SpO2 96%   BMI 27.26 kg/m   Wt Readings from Last 3 Encounters:  06/29/18 201 lb (91.2 kg)  03/30/18 194 lb 8 oz (88.2 kg)  03/02/18 193 lb (87.5 kg)    Physical Exam Vitals signs and nursing note reviewed.  Constitutional:      Appearance: Normal appearance.  HENT:     Head: Atraumatic.  Eyes:     Extraocular Movements: Extraocular movements intact.     Conjunctiva/sclera: Conjunctivae normal.  Neck:     Musculoskeletal: Normal range of motion and neck supple.  Cardiovascular:     Rate and Rhythm: Normal rate and regular rhythm.  Pulmonary:     Effort: Pulmonary effort is normal.     Breath sounds: Normal breath sounds.  Musculoskeletal: Normal range of motion.  Skin:    General: Skin is warm and dry.  Neurological:     General: No focal deficit present.     Mental Status: He is oriented to person, place, and time.  Psychiatric:        Mood and Affect: Mood normal.        Thought Content: Thought content normal.        Judgment: Judgment normal.     Results for orders placed or performed in visit on  03/30/18  Microscopic Examination  Result Value Ref Range   WBC, UA None seen 0 - 5 /hpf   RBC, UA 0-2 0 - 2 /hpf   Epithelial Cells (non renal) 0-10 0 - 10 /hpf   Bacteria, UA None seen None seen/Few  HIV Antibody (routine testing w rflx)  Result Value Ref Range   HIV Screen 4th Generation wRfx Non Reactive Non Reactive  CBC with Differential/Platelet  Result Value Ref Range   WBC 4.9 3.4 - 10.8 x10E3/uL   RBC 4.40 4.14 - 5.80 x10E6/uL   Hemoglobin 12.7 (L) 13.0 - 17.7 g/dL   Hematocrit 65.7 90.3 - 51.0 %   MCV 90 79 - 97 fL   MCH 28.9 26.6 - 33.0 pg   MCHC 32.2 31.5 - 35.7 g/dL   RDW 83.3 38.3 - 29.1 %   Platelets 283 150 - 450 x10E3/uL   Neutrophils 37 Not Estab. %   Lymphs 54 Not Estab. %   Monocytes 7 Not Estab. %   Eos 1 Not Estab. %   Basos 1 Not Estab. %   Neutrophils Absolute 1.8 1.4 - 7.0 x10E3/uL   Lymphocytes Absolute 2.6 0.7 - 3.1 x10E3/uL   Monocytes Absolute 0.3 0.1 - 0.9 x10E3/uL   EOS (ABSOLUTE) 0.1 0.0 - 0.4 x10E3/uL   Basophils Absolute 0.1 0.0 - 0.2 x10E3/uL   Immature Granulocytes 0 Not Estab. %   Immature Grans (Abs) 0.0 0.0 - 0.1 x10E3/uL   Hematology Comments: Note:   Comprehensive metabolic panel  Result Value Ref Range   Glucose 80 65 - 99 mg/dL   BUN 6 6 - 24 mg/dL   Creatinine, Ser 9.16 0.76 - 1.27 mg/dL   GFR calc non Af Amer 72 >59 mL/min/1.73   GFR calc Af Amer 84 >59 mL/min/1.73   BUN/Creatinine Ratio 5 (L) 9 - 20   Sodium 142 134 - 144 mmol/L   Potassium 4.3 3.5 - 5.2 mmol/L   Chloride 102 96 - 106 mmol/L   CO2 26 20 - 29 mmol/L   Calcium 9.6 8.7 - 10.2 mg/dL   Total Protein 7.2 6.0 - 8.5 g/dL   Albumin 4.3 3.5 - 5.5 g/dL   Globulin, Total 2.9 1.5 - 4.5 g/dL   Albumin/Globulin Ratio 1.5 1.2 - 2.2  Bilirubin Total 0.2 0.0 - 1.2 mg/dL   Alkaline Phosphatase 73 39 - 117 IU/L   AST 17 0 - 40 IU/L   ALT 18 0 - 44 IU/L  Lipid Panel w/o Chol/HDL Ratio  Result Value Ref Range   Cholesterol, Total 183 100 - 199 mg/dL   Triglycerides  130156 (H) 0 - 149 mg/dL   HDL 30 (L) >86>39 mg/dL   VLDL Cholesterol Cal 31 5 - 40 mg/dL   LDL Calculated 578122 (H) 0 - 99 mg/dL  TSH  Result Value Ref Range   TSH 0.869 0.450 - 4.500 uIU/mL  UA/M w/rflx Culture, Routine  Result Value Ref Range   Specific Gravity, UA 1.010 1.005 - 1.030   pH, UA 6.0 5.0 - 7.5   Color, UA Yellow Yellow   Appearance Ur Clear Clear   Leukocytes, UA Negative Negative   Protein, UA Negative Negative/Trace   Glucose, UA Negative Negative   Ketones, UA Negative Negative   RBC, UA Trace (A) Negative   Bilirubin, UA Negative Negative   Urobilinogen, Ur 0.2 0.2 - 1.0 mg/dL   Nitrite, UA Negative Negative   Microscopic Examination See below:   PSA  Result Value Ref Range   Prostate Specific Ag, Serum 1.8 0.0 - 4.0 ng/mL      Assessment & Plan:   Problem List Items Addressed This Visit      Cardiovascular and Mediastinum   Essential hypertension - Primary    Discussed importance of medication compliance, continue losartan and add 5 mg amlodipine. Continue home monitoring, work on [employer] diet and exercise. F/u in 1 month. Call with persistent abnormal readings in meantime. Check bmp today      Relevant Medications   amLODipine (NORVASC) 5 MG tablet   losartan (COZAAR) 100 MG tablet   Other Relevant Orders   Basic Metabolic Panel (BMET)    Other Visit Diagnoses    Insomnia, unspecified type       Discussed sleep hygiene, trazodone 1/2 to full tab nightly as needed. Risks and side effects reviewed. F/u in 1 month       Follow up plan: Return in about 4 weeks (around 07/27/2018) for BP, sleep f/u.

## 2018-06-30 ENCOUNTER — Encounter: Payer: Self-pay | Admitting: Family Medicine

## 2018-06-30 LAB — BASIC METABOLIC PANEL
BUN / CREAT RATIO: 7 — AB (ref 9–20)
BUN: 8 mg/dL (ref 6–24)
CALCIUM: 9.6 mg/dL (ref 8.7–10.2)
CO2: 23 mmol/L (ref 20–29)
Chloride: 102 mmol/L (ref 96–106)
Creatinine, Ser: 1.19 mg/dL (ref 0.76–1.27)
GFR calc non Af Amer: 76 mL/min/{1.73_m2} (ref >59)
GFR, EST AFRICAN AMERICAN: 88 mL/min/{1.73_m2} (ref >59)
Glucose: 84 mg/dL (ref 65–99)
Potassium: 4.2 mmol/L (ref 3.5–5.2)
Sodium: 140 mmol/L (ref 134–144)

## 2018-07-26 ENCOUNTER — Other Ambulatory Visit: Payer: Self-pay | Admitting: Family Medicine

## 2018-07-26 NOTE — Telephone Encounter (Signed)
Requested Prescriptions  Pending Prescriptions Disp Refills  . traZODone (DESYREL) 50 MG tablet [Pharmacy Med Name: TRAZODONE 50 MG TABLET] 30 tablet 0    Sig: TAKE 0.5-1 TABLETS (25-50 MG TOTAL) BY MOUTH AT BEDTIME AS NEEDED FOR SLEEP.     Psychiatry: Antidepressants - Serotonin Modulator Passed - 07/26/2018  1:45 AM      Passed - Valid encounter within last 6 months    Recent Outpatient Visits          3 weeks ago Essential hypertension   [facility] Roosvelt Maser [place], [place]   3 months ago Chest pain, unspecified type   [facility] Roosvelt Maser [place], [place]   4 months ago Essential hypertension   [facility], Salley Hews, [place]      Future Appointments            In 1 week Maurice March, Salley Hews, PA-C [facility], [facility]

## 2018-08-03 ENCOUNTER — Ambulatory Visit: Payer: BLUE CROSS/BLUE SHIELD | Admitting: Family Medicine

## 2018-08-19 ENCOUNTER — Other Ambulatory Visit: Payer: Self-pay | Admitting: Family Medicine

## 2018-08-21 NOTE — Telephone Encounter (Signed)
Requested medication (s) are due for refill today: yes  Requested medication (s) are on the active medication list: yes  Last refill:  06/29/2018   Future visit scheduled: no  Notes to clinic:  Calcium channel blockers failed.  Requested Prescriptions  Pending Prescriptions Disp Refills   amLODipine (NORVASC) 5 MG tablet [Pharmacy Med Name: AMLODIPINE BESYLATE 5 MG TAB] 30 tablet 0    Sig: TAKE 1 TABLET BY MOUTH EVERY DAY     Cardiovascular:  Calcium Channel Blockers Failed - 08/19/2018  3:40 PM      Failed - Last BP in normal range    BP Readings from Last 1 Encounters:  06/29/18 (!) 145/100         Passed - Valid encounter within last 6 months    Recent Outpatient Visits          1 month ago Essential hypertension   [facility] Roosvelt Maser [place], [place]   4 months ago Chest pain, unspecified type   [facility] [place], [place]   5 months ago Essential hypertension   [facility] Roosvelt Maser [place], [place]

## 2018-08-22 NOTE — Telephone Encounter (Signed)
SCHEDULED APPOINTMENT FOR 3/19 @ 8:45.Explained you would send enough meds until then.    Thank You

## 2018-08-22 NOTE — Telephone Encounter (Signed)
Overdue for f/u, please schedule asap and I will refill to bridge until his appt

## 2018-08-24 DIAGNOSIS — J019 Acute sinusitis, unspecified: Secondary | ICD-10-CM | POA: Diagnosis not present

## 2018-08-26 ENCOUNTER — Other Ambulatory Visit: Payer: Self-pay | Admitting: Family Medicine

## 2018-08-28 ENCOUNTER — Other Ambulatory Visit: Payer: Self-pay | Admitting: Family Medicine

## 2018-08-28 NOTE — Telephone Encounter (Signed)
Please review for refill. Office visit scheduled for 08/31/2018. Canceled appointment in February. PCP Roosvelt Maser Losartan 100 mg LR 06/29/2018 for 30 tabs Trazodone 50 mg LR 07/26/2018 for 30 tabs

## 2018-08-31 ENCOUNTER — Encounter: Payer: Self-pay | Admitting: Family Medicine

## 2018-08-31 ENCOUNTER — Other Ambulatory Visit: Payer: Self-pay

## 2018-08-31 ENCOUNTER — Ambulatory Visit: Payer: BLUE CROSS/BLUE SHIELD | Admitting: Family Medicine

## 2018-08-31 VITALS — BP 132/93 | HR 78 | Temp 98.5°F | Ht 72.0 in | Wt 201.0 lb

## 2018-08-31 DIAGNOSIS — I10 Essential (primary) hypertension: Secondary | ICD-10-CM

## 2018-08-31 MED ORDER — AMLODIPINE BESYLATE 10 MG PO TABS: 10.0000 mg | ORAL_TABLET | Freq: Every day | ORAL | 0 refills | Status: DC

## 2018-08-31 NOTE — Progress Notes (Signed)
BP (!) 132/93   Pulse 78   Temp 98.5 F (36.9 C) (Oral)   Ht 6' (1.829 m)   Wt 201 lb (91.2 kg)   SpO2 95%   BMI 27.26 kg/m    Subjective:    Patient ID: Cody Aguilar, male    DOB: 1976/02/09, 43 y.o.   MRN: [id number]  HPI: Cody Aguilar is a 44 y.o. male  Chief Complaint  Patient presents with  . Hypertension     refills   Here today for HTN f/u. Currently on 5 mg amlodipine, tolerating well without side effects. Taking faithfully. Home BP readings have been 130s-140s/90s typically. Denies CP, SOB, HAs, dizziness. Trying to eat well and stay active.   Relevant past medical, surgical, family and social history reviewed and updated as indicated. Interim medical history since our last visit reviewed. Allergies and medications reviewed and updated.  Review of Systems  Per HPI unless specifically indicated above     Objective:    BP (!) 132/93   Pulse 78   Temp 98.5 F (36.9 C) (Oral)   Ht 6' (1.829 m)   Wt 201 lb (91.2 kg)   SpO2 95%   BMI 27.26 kg/m   Wt Readings from Last 3 Encounters:  08/31/18 201 lb (91.2 kg)  06/29/18 201 lb (91.2 kg)  03/30/18 194 lb 8 oz (88.2 kg)    Physical Exam Vitals signs and nursing note reviewed.  Constitutional:      Appearance: Normal appearance.  HENT:     Head: Atraumatic.  Eyes:     Extraocular Movements: Extraocular movements intact.     Conjunctiva/sclera: Conjunctivae normal.  Neck:     Musculoskeletal: Normal range of motion and neck supple.  Cardiovascular:     Rate and Rhythm: Normal rate and regular rhythm.  Pulmonary:     Effort: Pulmonary effort is normal.     Breath sounds: Normal breath sounds.  Musculoskeletal: Normal range of motion.  Skin:    General: Skin is warm and dry.  Neurological:     General: No focal deficit present.     Mental Status: He is oriented to person, place, and time.  Psychiatric:        Mood and Affect: Mood normal.        Thought Content: Thought content  normal.        Judgment: Judgment normal.     Results for orders placed or performed in visit on 06/29/18  Basic Metabolic Panel (BMET)  Result Value Ref Range   Glucose 84 65 - 99 mg/dL   BUN 8 6 - 24 mg/dL   Creatinine, Ser 2.80 0.76 - 1.27 mg/dL   GFR calc non Af Amer 76 >59 mL/min/1.73   GFR calc Af Amer 88 >59 mL/min/1.73   BUN/Creatinine Ratio 7 (L) 9 - 20   Sodium 140 134 - 144 mmol/L   Potassium 4.2 3.5 - 5.2 mmol/L   Chloride 102 96 - 106 mmol/L   CO2 23 20 - 29 mmol/L   Calcium 9.6 8.7 - 10.2 mg/dL      Assessment & Plan:   Problem List Items Addressed This Visit      Cardiovascular and Mediastinum   Essential hypertension - Primary    BPs not quite to goal, increase to 10 mg amlodipine and continue home monitoring. F/u in 1 month, call with issues or persistent abnormal readings in meantime      Relevant Medications   amLODipine (NORVASC)  10 MG tablet       Follow up plan: Return in about 4 weeks (around 09/28/2018) for BP.

## 2018-09-05 NOTE — Assessment & Plan Note (Signed)
BPs not quite to goal, increase to 10 mg amlodipine and continue home monitoring. F/u in 1 month, call with issues or persistent abnormal readings in meantime

## 2018-10-05 ENCOUNTER — Other Ambulatory Visit: Payer: Self-pay

## 2018-10-05 ENCOUNTER — Encounter: Payer: Self-pay | Admitting: Family Medicine

## 2018-10-05 ENCOUNTER — Ambulatory Visit (INDEPENDENT_AMBULATORY_CARE_PROVIDER_SITE_OTHER): Payer: BLUE CROSS/BLUE SHIELD | Admitting: Family Medicine

## 2018-10-05 DIAGNOSIS — G4700 Insomnia, unspecified: Secondary | ICD-10-CM | POA: Diagnosis not present

## 2018-10-05 DIAGNOSIS — I10 Essential (primary) hypertension: Secondary | ICD-10-CM | POA: Diagnosis not present

## 2018-10-05 MED ORDER — LOSARTAN POTASSIUM 100 MG PO TABS: 100.0000 mg | ORAL_TABLET | Freq: Every day | ORAL | 1 refills | Status: DC

## 2018-10-05 MED ORDER — AMLODIPINE BESYLATE 10 MG PO TABS: 10.0000 mg | ORAL_TABLET | Freq: Every day | ORAL | 1 refills | Status: DC

## 2018-10-05 MED ORDER — TRAZODONE HCL 50 MG PO TABS: 25.0000 mg | ORAL_TABLET | Freq: Every evening | ORAL | 1 refills | Status: DC | PRN

## 2018-10-05 MED ORDER — CYCLOBENZAPRINE HCL 10 MG PO TABS: 10.0000 mg | ORAL_TABLET | Freq: Every evening | ORAL | 0 refills | Status: DC | PRN

## 2018-10-05 MED ORDER — MELOXICAM 15 MG PO TABS: 15.0000 mg | ORAL_TABLET | Freq: Every day | ORAL | 0 refills | Status: DC

## 2018-10-05 NOTE — Assessment & Plan Note (Signed)
Improved with increase in amlodipine. Conitnue current regimen, will try switching back to losartan if back in stock. Pt to call with BP and HR readings once he is able to obtain them

## 2018-10-05 NOTE — Assessment & Plan Note (Signed)
Stable on 1/2 tab trazodone QHS prn. Continue current regimen and good sleep hygiene

## 2018-10-05 NOTE — Progress Notes (Signed)
There were no vitals taken for this visit.  Patient's BP monitor ran out of batteries, he will go to a drugstore or get new batteries and call back with his vitals for today as soon as he can Subjective:    Patient ID: Cody Aguilar, male    DOB: [birth year], 43 y.o.   MRN: [id number]  HPI: Cody Aguilar is a 43 y.o. male  Chief Complaint  Patient presents with  . Hypertension    Running 130s/80    . This visit was completed via WebEx due to the restrictions of the COVID-19 pandemic. All issues as above were discussed and addressed. Physical exam was done as above through visual confirmation on WebEx. If it was felt that the patient should be evaluated in the office, they were directed there. The patient verbally consented to this visit. . Location of the patient: in his car . Location of the provider: work . Those involved with this call:  . Provider: Roosvelt Maserachel Lynsee Wands, PA-C . CMA: Sheilah MinsJada Fox, CMA . Front Desk/Registration: Harriet PhoJoliza Johnson  . Time spent on call: 15 minutes with patient face to face via video conference. More than 50% of this time was spent in counseling and coordination of care. 5 minutes total spent in review of patient's record and preparation of their chart.  I verified patient identity using two factors (patient name and date of birth). Patient consents verbally to being seen via telemedicine visit today.   Patient presents today for BP and insomnia f/u.   BPs running 130s/80s when checking at home since amlodipine was increased to 10 mg. Currently also taking telmisartan as losartan was out of stock. Tolerating medicines well, taking faithfully without side effects. Denies CP, SOB, HAs, dizizness.   Trazodone helping with sleep, taking about 2x/week as needed. No side effects reported.   Relevant past medical, surgical, family and social history reviewed and updated as indicated. Interim medical history since our last visit reviewed. Allergies and  medications reviewed and updated.  Review of Systems  Per HPI unless specifically indicated above     Objective:    There were no vitals taken for this visit.  Wt Readings from Last 3 Encounters:  08/31/18 201 lb (91.2 kg)  06/29/18 201 lb (91.2 kg)  03/30/18 194 lb 8 oz (88.2 kg)    Physical Exam Vitals signs and nursing note reviewed.  Constitutional:      General: He is not in acute distress.    Appearance: Normal appearance.  HENT:     Head: Atraumatic.     Right Ear: External ear normal.     Left Ear: External ear normal.     Mouth/Throat:     Mouth: Mucous membranes are moist.  Eyes:     Extraocular Movements: Extraocular movements intact.     Conjunctiva/sclera: Conjunctivae normal.  Neck:     Musculoskeletal: Normal range of motion.  Pulmonary:     Effort: Pulmonary effort is normal. No respiratory distress.  Musculoskeletal: Normal range of motion.  Skin:    General: Skin is dry.     Findings: No erythema or rash.  Neurological:     Mental Status: He is oriented to person, place, and time.  Psychiatric:        Mood and Affect: Mood normal.        Thought Content: Thought content normal.        Judgment: Judgment normal.     Results for orders placed or performed in  visit on 06/29/18  Basic Metabolic Panel (BMET)  Result Value Ref Range   Glucose 84 65 - 99 mg/dL   BUN 8 6 - 24 mg/dL   Creatinine, Ser 4.43 0.76 - 1.27 mg/dL   GFR calc non Af Amer 76 >59 mL/min/1.73   GFR calc Af Amer 88 >59 mL/min/1.73   BUN/Creatinine Ratio 7 (L) 9 - 20   Sodium 140 134 - 144 mmol/L   Potassium 4.2 3.5 - 5.2 mmol/L   Chloride 102 96 - 106 mmol/L   CO2 23 20 - 29 mmol/L   Calcium 9.6 8.7 - 10.2 mg/dL      Assessment & Plan:   Problem List Items Addressed This Visit      Cardiovascular and Mediastinum   Essential hypertension - Primary    Improved with increase in amlodipine. Conitnue current regimen, will try switching back to losartan if back in stock. Pt  to call with BP and HR readings once he is able to obtain them      Relevant Medications   losartan (COZAAR) 100 MG tablet   amLODipine (NORVASC) 10 MG tablet     Other   Insomnia    Stable on 1/2 tab trazodone QHS prn. Continue current regimen and good sleep hygiene          Follow up plan: Return in about 6 months (around 04/06/2019) for CPE.

## 2018-10-28 ENCOUNTER — Other Ambulatory Visit: Payer: Self-pay | Admitting: Family Medicine

## 2018-10-28 NOTE — Telephone Encounter (Signed)
Can this patient receive a refill on this medication? 

## 2018-12-14 DIAGNOSIS — Z20828 Contact with and (suspected) exposure to other viral communicable diseases: Secondary | ICD-10-CM | POA: Diagnosis not present

## 2019-01-21 ENCOUNTER — Other Ambulatory Visit: Payer: Self-pay | Admitting: Family Medicine

## 2019-01-21 NOTE — Telephone Encounter (Signed)
Requested medication (s) are due for refill today: yes  Requested medication (s) are on the active medication list: yes  Last refill:  10/05/18  Future visit scheduled: yes  Notes to clinic:  Medication not delegated to NT to refill   Requested Prescriptions  Pending Prescriptions Disp Refills   cyclobenzaprine (FLEXERIL) 10 MG tablet [Pharmacy Med Name: CYCLOBENZAPRINE 10 MG TABLET] 30 tablet 0    Sig: TAKE 1 TABLET BY MOUTH AT BEDTIME AS NEEDED FOR MUSCLE SPASMS     Not Delegated - Analgesics:  Muscle Relaxants Failed - 01/21/2019  2:18 PM      Failed - This refill cannot be delegated      Passed - Valid encounter within last 6 months    Recent Outpatient Visits          3 months ago Essential hypertension   [facility] Volney American, [place]   4 months ago Essential hypertension   [facility] Merrie Roof [place], [place]   6 months ago Essential hypertension   [place], [place], [place]   9 months ago Chest pain, unspecified type   [facility] Volney [facility], [place]   10 months ago Essential hypertension   [place], Lilia Argue, [place]      Future Appointments            In 2 months Orene Desanctis, Lilia Argue, [place], [place]

## 2019-02-20 ENCOUNTER — Other Ambulatory Visit: Payer: Self-pay | Admitting: Family Medicine

## 2019-02-20 NOTE — Telephone Encounter (Signed)
Requested medication (s) are due for refill today: yes  Requested medication (s) are on the active medication list: yes  Last refill: 01/22/2019  Future visit scheduled: yes  Notes to clinic:  This medication cannot be delegated    Requested Prescriptions  Pending Prescriptions Disp Refills   cyclobenzaprine (FLEXERIL) 10 MG tablet [Pharmacy Med Name: CYCLOBENZAPRINE 10 MG TABLET] 30 tablet 0    Sig: TAKE 1 TABLET BY MOUTH AT BEDTIME AS NEEDED FOR MUSCLE SPASMS     Not Delegated - Analgesics:  Muscle Relaxants Failed - 02/20/2019  1:30 PM      Failed - This refill cannot be delegated      Passed - Valid encounter within last 6 months    Recent Outpatient Visits          4 months ago Essential hypertension   [facility] Volney American, [place]   5 months ago Essential hypertension   [place], [place], [place]   7 months ago Essential hypertension   [place], [place], [place]   10 months ago Chest pain, unspecified type   [facility] Volney American, [place]   11 months ago Essential hypertension   [place], Lilia Argue, [place]      Future Appointments            In 1 month Orene Desanctis, Lilia Argue, [place], [place]

## 2019-02-20 NOTE — Telephone Encounter (Signed)
Routing to provider  

## 2019-02-22 ENCOUNTER — Ambulatory Visit: Payer: Self-pay | Admitting: *Deleted

## 2019-02-22 NOTE — Telephone Encounter (Addendum)
Pt called with complaints pain on both sides of his lower back which travels to his left arm, down the back of both legs; his pain started 02/17/2019; the pt says that this happened 2-3 months previously; he has taken Flexeril with no relief in symptoms; he has constant pain but intermittent sharp pains in his lower back; he rates his constant pain at 7.5 out of 10; he does not have numbness or tingling; he also says that he urinates 10-15 times per night for the past 2 months; he has also used ice and heat to the site, and tylenol and ibuprofen; recommendations made per nurse triage protocol; he verbalized understanding; he sees Merrie Roof, [facility]; pt transferred to [facility] for scheduling.

## 2019-02-22 NOTE — Telephone Encounter (Signed)
  Reason for Disposition . [1] MODERATE back pain (e.g., interferes with normal activities) AND [2] present > 3 days  Answer Assessment - Initial Assessment Questions 1. ONSET: "When did the pain begin?"      02/17/2019. 2. LOCATION: "Where does it hurt?" (upper, mid or lower back)    Lower back on both sides 3. SEVERITY: "How bad is the pain?"  (e.g., Scale 1-10; mild, moderate, or severe)   - MILD (1-3): doesn't interfere with normal activities    - MODERATE (4-7): interferes with normal activities or awakens from sleep    - SEVERE (8-10): excruciating pain, unable to do any normal activities      7.5 out of 10 4. PATTERN: "Is the pain constant?" (e.g., yes, no; constant, intermittent)     constant 5. RADIATION: "Does the pain shoot into your legs or elsewhere?"    Left arm,and down the back of both legs 6. CAUSE:  "What do you think is causing the back pain?"      Not sure 7. BACK OVERUSE:  "Any recent lifting of heavy objects, strenuous work or exercise?"     Pt moves furniture for a living 8. MEDICATIONS: "What have you taken so far for the pain?" (e.g., nothing, acetaminophen, NSAIDS)     Flexeril, tylenol, ibuprofen, and advil 9. NEUROLOGIC SYMPTOMS: "Do you have any weakness, numbness, or problems with bowel/bladder control?"     Numbness in fingertips and toes 10. OTHER SYMPTOMS: "Do you have any other symptoms?" (e.g., fever, abdominal pain, burning with urination, blood in urine)    no 11. PREGNANCY: "Is there any chance you are pregnant?" (e.g., yes, no; LMP)       n/a  Protocols used: BACK PAIN-A-AH

## 2019-02-23 ENCOUNTER — Encounter: Payer: Self-pay | Admitting: Family Medicine

## 2019-02-23 ENCOUNTER — Other Ambulatory Visit: Payer: Self-pay

## 2019-02-23 ENCOUNTER — Ambulatory Visit (INDEPENDENT_AMBULATORY_CARE_PROVIDER_SITE_OTHER): Payer: BC Managed Care – PPO | Admitting: Family Medicine

## 2019-02-23 VITALS — BP 149/110 | HR 74 | Temp 98.1°F | Ht 72.0 in | Wt 206.0 lb

## 2019-02-23 DIAGNOSIS — M5442 Lumbago with sciatica, left side: Secondary | ICD-10-CM

## 2019-02-23 DIAGNOSIS — I10 Essential (primary) hypertension: Secondary | ICD-10-CM | POA: Diagnosis not present

## 2019-02-23 DIAGNOSIS — M5441 Lumbago with sciatica, right side: Secondary | ICD-10-CM | POA: Diagnosis not present

## 2019-02-23 DIAGNOSIS — R10A2 Flank pain, left side: Secondary | ICD-10-CM

## 2019-02-23 DIAGNOSIS — R109 Unspecified abdominal pain: Secondary | ICD-10-CM

## 2019-02-23 LAB — UA/M W/RFLX CULTURE, ROUTINE
Bilirubin, UA: NEGATIVE
Glucose, UA: NEGATIVE
Ketones, UA: NEGATIVE
Leukocytes,UA: NEGATIVE
Nitrite, UA: NEGATIVE
Protein,UA: NEGATIVE
Specific Gravity, UA: 1.015 (ref 1.005–1.030)
Urobilinogen, Ur: 0.2 mg/dL (ref 0.2–1.0)
pH, UA: 6.5 (ref 5.0–7.5)

## 2019-02-23 LAB — MICROSCOPIC EXAMINATION
Bacteria, UA: NONE SEEN
Epithelial Cells (non renal): NONE SEEN /hpf (ref 0–10)
WBC, UA: NONE SEEN /hpf (ref 0–5)

## 2019-02-23 MED ORDER — CYCLOBENZAPRINE HCL 10 MG PO TABS: 10.0000 mg | ORAL_TABLET | Freq: Three times a day (TID) | ORAL | 0 refills | Status: DC | PRN

## 2019-02-23 MED ORDER — PREDNISONE 10 MG PO TABS: ORAL_TABLET | ORAL | 0 refills | Status: DC

## 2019-02-23 NOTE — Progress Notes (Signed)
BP (!) 149/110   Pulse 74   Temp 98.1 F (36.7 C) (Oral)   Ht 6' (1.829 m)   Wt 206 lb (93.4 kg)   SpO2 97%   BMI 27.94 kg/m    Subjective:    Patient ID: Cody Aguilar, male    DOB: 1975/10/06, 43 y.o.   MRN: [id number]  HPI: Cody Aguilar is a 43 y.o. male  Chief Complaint  Patient presents with  . Back Pain    lower. x a few months. got worse this last week. tried tylenol and naproxen, ibuprofen OTC. not helping  . Leg Pain    bilateral, back of legs   Has been having some back soreness for several months, but now since last week severe low back pain radiating down backs of both legs. Worse on the left side. Mostly dull and achy but depending on type of movement will have sharp shooting pains down legs. Some numbness and tingling down to feet b/l. Has a hx of back strains but no known major injuries. Has been trying aleve, tylenol, ibuprofen and flexeril not helping. 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 Denies saddle paresthesias, bowel or bladder incontinence, fevers, chills.   Does note some urinary frequency the past few weeks when asked about urinary incontinence, states he always urinates often but it's been increased recently. No other urinary sxs noted.   Relevant past medical, surgical, family and social history reviewed and updated as indicated. Interim medical history since our last visit reviewed. Allergies and medications reviewed and updated.  Review of Systems  Per HPI unless specifically indicated above     Objective:    BP (!) 149/110   Pulse 74   Temp 98.1 F (36.7 C) (Oral)   Ht 6' (1.829 m)   Wt 206 lb (93.4 kg)   SpO2 97%   BMI 27.94 kg/m   Wt Readings from Last 3 Encounters:  02/23/19 206 lb (93.4 kg)  08/31/18 201 lb (91.2 kg)  06/29/18 201 lb (91.2 kg)    Physical Exam Vitals signs and nursing note reviewed.  Constitutional:      Appearance: Normal appearance.  HENT:     Head: Atraumatic.  Eyes:     Extraocular  Movements: Extraocular movements intact.     Conjunctiva/sclera: Conjunctivae normal.  Neck:     Musculoskeletal: Normal range of motion and neck supple.  Cardiovascular:     Rate and Rhythm: Normal rate and regular rhythm.  Pulmonary:     Effort: Pulmonary effort is normal.     Breath sounds: Normal breath sounds.  Abdominal:     General: Bowel sounds are normal.     Palpations: Abdomen is soft.     Tenderness: There is no abdominal tenderness. There is no right CVA tenderness, left CVA tenderness or guarding.  Musculoskeletal:     Comments: Antalgic gait + SLR b/l, patient reporting reproduction of radiating pain when legs extended to about 30-40 degrees b/l  Skin:    General: Skin is warm and dry.     Findings: No erythema.  Neurological:     General: No focal deficit present.     Mental Status: He is oriented to person, place, and time.  Psychiatric:        Mood and Affect: Mood normal.        Thought Content: Thought content normal.        Judgment: Judgment normal.     Results for orders placed or  performed in visit on 02/23/19  Microscopic Examination   URINE  Result Value Ref Range   WBC, UA None seen 0 - 5 /hpf   RBC 0-2 0 - 2 /hpf   Epithelial Cells (non renal) None seen 0 - 10 /hpf   Bacteria, UA None seen None seen/Few  UA/M w/rflx Culture, Routine   Specimen: Urine   URINE  Result Value Ref Range   Specific Gravity, UA 1.015 1.005 - 1.030   pH, UA 6.5 5.0 - 7.5   Color, UA Yellow Yellow   Appearance Ur Clear Clear   Leukocytes,UA Negative Negative   Protein,UA Negative Negative/Trace   Glucose, UA Negative Negative   Ketones, UA Negative Negative   RBC, UA 1+ (A) Negative   Bilirubin, UA Negative Negative   Urobilinogen, Ur 0.2 0.2 - 1.0 mg/dL   Nitrite, UA Negative Negative   Microscopic Examination See below:       Assessment & Plan:   Problem List Items Addressed This Visit      Cardiovascular and Mediastinum   Essential hypertension    BP  elevated today, but pt in significant pain. Set to return next month for regular f/u. Will recheck then and adjust if still high       Other Visit Diagnoses    Acute bilateral low back pain with bilateral sciatica    -  Primary   WIll likely need MRI, Neurosurgery referral but will manage conservatively first and obtain x-ray. Tx with prednisone, flexeril, several days off work, rest.    Relevant Medications   predniSONE (DELTASONE) 10 MG tablet   cyclobenzaprine (FLEXERIL) 10 MG tablet   Other Relevant Orders   DG Lumbar Spine Complete   Left flank pain       Check U/A given increased frequency. Push fluids   Relevant Orders   UA/M w/rflx Culture, Routine (Completed)      25 minutes spent today in direct care and counseling with patient  Follow up plan: Return if symptoms worsen or fail to improve.

## 2019-02-27 NOTE — Assessment & Plan Note (Signed)
BP elevated today, but pt in significant pain. Set to return next month for regular f/u. Will recheck then and adjust if still high

## 2019-03-18 ENCOUNTER — Other Ambulatory Visit: Payer: Self-pay | Admitting: Family Medicine

## 2019-04-09 ENCOUNTER — Encounter: Payer: BLUE CROSS/BLUE SHIELD | Admitting: Family Medicine

## 2019-04-12 ENCOUNTER — Encounter: Payer: Self-pay | Admitting: Family Medicine

## 2019-04-12 ENCOUNTER — Ambulatory Visit (INDEPENDENT_AMBULATORY_CARE_PROVIDER_SITE_OTHER): Payer: BC Managed Care – PPO | Admitting: Family Medicine

## 2019-04-12 ENCOUNTER — Other Ambulatory Visit: Payer: Self-pay

## 2019-04-12 VITALS — BP 139/101 | HR 82 | Temp 98.4°F | Ht 71.5 in | Wt 212.0 lb

## 2019-04-12 DIAGNOSIS — F17210 Nicotine dependence, cigarettes, uncomplicated: Secondary | ICD-10-CM | POA: Diagnosis not present

## 2019-04-12 DIAGNOSIS — N529 Male erectile dysfunction, unspecified: Secondary | ICD-10-CM | POA: Diagnosis not present

## 2019-04-12 DIAGNOSIS — I10 Essential (primary) hypertension: Secondary | ICD-10-CM

## 2019-04-12 DIAGNOSIS — G4700 Insomnia, unspecified: Secondary | ICD-10-CM | POA: Diagnosis not present

## 2019-04-12 DIAGNOSIS — Z0000 Encounter for general adult medical examination without abnormal findings: Secondary | ICD-10-CM | POA: Diagnosis not present

## 2019-04-12 LAB — MICROSCOPIC EXAMINATION
Bacteria, UA: NONE SEEN
WBC, UA: NONE SEEN /hpf (ref 0–5)

## 2019-04-12 LAB — UA/M W/RFLX CULTURE, ROUTINE
Bilirubin, UA: NEGATIVE
Glucose, UA: NEGATIVE
Ketones, UA: NEGATIVE
Leukocytes,UA: NEGATIVE
Nitrite, UA: NEGATIVE
Protein,UA: NEGATIVE
Specific Gravity, UA: <1.005 — ABNORMAL LOW (ref 1.005–1.030)
Urobilinogen, Ur: 0.2 mg/dL (ref 0.2–1.0)
pH, UA: 5.5 (ref 5.0–7.5)

## 2019-04-12 MED ORDER — BUPROPION HCL ER (SMOKING DET) 150 MG PO TB12: 150.0000 mg | ORAL_TABLET | Freq: Two times a day (BID) | ORAL | 0 refills | Status: DC

## 2019-04-12 MED ORDER — AMLODIPINE BESYLATE 10 MG PO TABS: 10.0000 mg | ORAL_TABLET | Freq: Every day | ORAL | 1 refills | Status: DC

## 2019-04-12 MED ORDER — LOSARTAN POTASSIUM-HCTZ 100-12.5 MG PO TABS: 1.0000 | ORAL_TABLET | Freq: Every day | ORAL | 0 refills | Status: DC

## 2019-04-12 MED ORDER — SILDENAFIL CITRATE 100 MG PO TABS: 100.0000 mg | ORAL_TABLET | Freq: Every day | ORAL | 0 refills | Status: DC | PRN

## 2019-04-12 NOTE — Assessment & Plan Note (Signed)
Stable and under good control, continue prn trazodone

## 2019-04-12 NOTE — Assessment & Plan Note (Signed)
BPs not at goal. Will add HCTZ to current regimen and recheck in 1 month. Monitor home readings, work on [employer], stress control, etc

## 2019-04-12 NOTE — Progress Notes (Signed)
BP (!) 139/101   Pulse 82   Temp 98.4 F (36.9 C) (Oral)   Ht 5' 11.5" (1.816 m)   Wt 212 lb (96.2 kg)   SpO2 96%   BMI 29.16 kg/m    Subjective:    Patient ID: Cody Aguilar, male    DOB: 05-26-1976, 43 y.o.   MRN: [id number]  HPI: Cody Aguilar is a 43 y.o. male presenting on 04/12/2019 for comprehensive medical examination. Current medical complaints include:see below  7-8 months of intermittent issues with both achieving and maintaining an erection. Has had this happen at times in the past but never for this long. No new stressors, relationship issues, recent physical illnesses, mood concerns, hx of abnormal testosterone levels. Has started new BP medication over the last year but does not notice it worsening since then.   Taking the trazodone about once every two weeks or so for insomnia. Works well when he feels he needs it. No side effects noted.   Wanting to quit smoking, has never tried quitting before.   He currently lives with: Interim Problems from his last visit: no  Depression Screen done today and results listed below:  Depression screen [facility] 2/9 04/12/2019 03/30/2018 03/02/2018  Decreased Interest 1 1 2   Down, Depressed, Hopeless 0 0 0  PHQ - 2 Score 1 1 2   Altered sleeping 1 0 1  Tired, decreased energy 2 1 2   Change in appetite 2 0 2  Feeling bad or failure about yourself  0 0 0  Trouble concentrating 0 0 0  Moving slowly or fidgety/restless 0 0 0  Suicidal thoughts 0 0 0  PHQ-9 Score 6 2 7     The patient does not have a history of falls. I did complete a risk assessment for falls. A plan of care for falls was documented.   Past Medical History:  Past Medical History:  Diagnosis Date  . High cholesterol     Surgical History:  Past Surgical History:  Procedure Laterality Date  . hemorrhoid removal      Medications:  Current Outpatient Medications on File Prior to Visit  Medication Sig  . cyclobenzaprine (FLEXERIL) 10 MG tablet  Take 1 tablet (10 mg total) by mouth 3 (three) times daily as needed for muscle spasms.  losartan (COZAAR) 100 MG tablet Take 1 tablet (100 mg total) by mouth daily.  . traZODone (DESYREL) 50 MG tablet TAKE 1/2 TO 1 TABLET (25-50 MG TOTAL) BY MOUTH AT BEDTIME AS NEEDED FOR SLEEP.   No current facility-administered medications on file prior to visit.     Allergies:  Allergies  Allergen Reactions  . Oxymetazoline Swelling    Pt. Reports when he has tried to use any OTC nasal spray his face swells    Social History:  Social History   Socioeconomic History  . Marital status: Married    Spouse name: Not on file  . Number of children: Not on file  . Years of education: Not on file  . Highest education level: Not on file  Occupational History  . Not on file  Social Needs  . Financial resource strain: Not on file  . Food insecurity    Worry: Not on file    Inability: Not on file  . Transportation needs    Medical: Not on file    Non-medical: Not on file  Tobacco Use  . Smoking status: Current Every Day Smoker    Packs/day: 0.50    Types:  Cigarettes  . Smokeless tobacco: Never Used  Substance and Sexual Activity  . Alcohol use: No  . Drug use: No  . Sexual activity: Yes  Lifestyle  . Physical activity    Days per week: Not on file    Minutes per session: Not on file  . Stress: Not on file  Relationships  . Social Musicianconnections    Talks on phone: Not on file    Gets together: Not on file    Attends religious service: Not on file    Active member of club or organization: Not on file    Attends meetings of clubs or organizations: Not on file    Relationship status: Not on file  . Intimate partner violence    Fear of current or ex partner: Not on file    Emotionally abused: Not on file    Physically abused: Not on file    Forced sexual activity: Not on file  Other Topics Concern  . Not on file  Social History Narrative  . Not on file   Social History   Tobacco  Use  Smoking Status Current Every Day Smoker  . Packs/day: 0.50  . Types: Cigarettes  Smokeless Tobacco Never Used   Social History   Substance and Sexual Activity  Alcohol Use No    Family History:  Family History  Problem Relation Age of Onset  . Hypertension Mother   . Diabetes Mother   . Heart disease Mother   . Multiple sclerosis Mother   . Hypertension Father   . Hypertension Brother   . Heart disease Maternal Grandmother   . Heart disease Maternal Grandfather     Past medical history, surgical history, medications, allergies, family history and social history reviewed with patient today and changes made to appropriate areas of the chart.   Review of Systems - General ROS: negative Psychological ROS: negative Ophthalmic ROS: negative ENT ROS: negative Allergy and Immunology ROS: negative Hematological and Lymphatic ROS: negative Endocrine ROS: negative Breast ROS: negative for breast lumps Respiratory ROS: no cough, shortness of breath, or wheezing Cardiovascular ROS: no chest pain or dyspnea on exertion Gastrointestinal ROS: no abdominal pain, change in bowel habits, or black or bloody stools Genito-Urinary ROS: positive for - ED Musculoskeletal ROS: negative Neurological ROS: no TIA or stroke symptoms Dermatological ROS: negative All other ROS negative except what is listed above and in the HPI.      Objective:    BP (!) 139/101   Pulse 82   Temp 98.4 F (36.9 C) (Oral)   Ht 5' 11.5" (1.816 m)   Wt 212 lb (96.2 kg)   SpO2 96%   BMI 29.16 kg/m   Wt Readings from Last 3 Encounters:  04/12/19 212 lb (96.2 kg)  02/23/19 206 lb (93.4 kg)  08/31/18 201 lb (91.2 kg)    Physical Exam Vitals signs and nursing note reviewed.  Constitutional:      General: He is not in acute distress.    Appearance: He is well-developed.  HENT:     Head: Atraumatic.     Right Ear: Tympanic membrane and external ear normal.     Left Ear: Tympanic membrane and external  ear normal.     Nose: Nose normal.     Mouth/Throat:     Mouth: Mucous membranes are moist.     Pharynx: Oropharynx is clear.  Eyes:     General: No scleral icterus.    Conjunctiva/sclera: Conjunctivae normal.  Pupils: Pupils are equal, round, and reactive to light.  Neck:     Musculoskeletal: Normal range of motion and neck supple.  Cardiovascular:     Rate and Rhythm: Normal rate and regular rhythm.     Heart sounds: Normal heart sounds. No murmur.  Pulmonary:     Effort: Pulmonary effort is normal. No respiratory distress.     Breath sounds: Normal breath sounds.  Abdominal:     General: Bowel sounds are normal. There is no distension.     Palpations: Abdomen is soft. There is no mass.     Tenderness: There is no abdominal tenderness. There is no guarding.  Genitourinary:    Comments: GU exam declined Musculoskeletal: Normal range of motion.        General: No tenderness.  Skin:    General: Skin is warm and dry.     Findings: No rash.  Neurological:     General: No focal deficit present.     Mental Status: He is alert and oriented to person, place, and time.     Deep Tendon Reflexes: Reflexes are normal and symmetric.  Psychiatric:        Mood and Affect: Mood normal.        Behavior: Behavior normal.        Thought Content: Thought content normal.        Judgment: Judgment normal.     Results for orders placed or performed in visit on 02/23/19  Microscopic Examination   URINE  Result Value Ref Range   WBC, UA None seen 0 - 5 /hpf   RBC 0-2 0 - 2 /hpf   Epithelial Cells (non renal) None seen 0 - 10 /hpf   Bacteria, UA None seen None seen/Few  UA/M w/rflx Culture, Routine   Specimen: Urine   URINE  Result Value Ref Range   Specific Gravity, UA 1.015 1.005 - 1.030   pH, UA 6.5 5.0 - 7.5   Color, UA Yellow Yellow   Appearance Ur Clear Clear   Leukocytes,UA Negative Negative   Protein,UA Negative Negative/Trace   Glucose, UA Negative Negative   Ketones, UA  Negative Negative   RBC, UA 1+ (A) Negative   Bilirubin, UA Negative Negative   Urobilinogen, Ur 0.2 0.2 - 1.0 mg/dL   Nitrite, UA Negative Negative   Microscopic Examination See below:       Assessment & Plan:   Problem List Items Addressed This Visit      Cardiovascular and Mediastinum   Essential hypertension - Primary    BPs not at goal. Will add HCTZ to current regimen and recheck in 1 month. Monitor home readings, work on [employer], stress control, etc      Relevant Medications   sildenafil (VIAGRA) 100 MG tablet   losartan-hydrochlorothiazide (HYZAAR) 100-12.5 MG tablet   amLODipine (NORVASC) 10 MG tablet   Other Relevant Orders   CBC with Differential/Platelet   Comprehensive metabolic panel   TSH   UA/M w/rflx Culture, Routine     Other   Insomnia    Stable and under good control, continue prn trazodone      Cigarette smoker    Discussed options, pt wanting to try zyban. Recommended habit replacement, avoiding temptations, and also utilizing patches, gums additionally.        Other Visit Diagnoses    Annual physical exam       Relevant Orders   Lipid Panel w/o Chol/HDL Ratio   Erectile dysfunction, unspecified erectile  dysfunction type       Unlikely related to BP medications, will check testosterone, continue working on BP control, stress reduction. Viagra prn with precautions reviewed   Relevant Orders   Testosterone, Free, Total, SHBG       Discussed aspirin prophylaxis for myocardial infarction prevention and decision was it was not indicated  LABORATORY TESTING:  Health maintenance labs ordered today as discussed above.   The natural history of prostate cancer and ongoing controversy regarding screening and potential treatment outcomes of prostate cancer has been discussed with the patient. The meaning of a false positive PSA and a false negative PSA has been discussed. He indicates understanding of the limitations of this screening test and wishes  not to proceed with screening PSA testing.   IMMUNIZATIONS:   - Tdap: Tetanus vaccination status reviewed: last tetanus booster within 10 years. - Influenza: Refused  PATIENT COUNSELING:    Sexuality: Discussed sexually transmitted diseases, partner selection, use of condoms, avoidance of unintended pregnancy  and contraceptive alternatives.   Advised to avoid cigarette smoking.  I discussed with the patient that most people either abstain from alcohol or drink within safe limits (<=14/week and <=4 drinks/occasion for males, <=7/weeks and <= 3 drinks/occasion for females) and that the risk for alcohol disorders and other health effects rises proportionally with the number of drinks per week and how often a drinker exceeds daily limits.  Discussed cessation/primary prevention of drug use and availability of treatment for abuse.   Diet: Encouraged to adjust caloric intake to maintain  or achieve ideal body weight, to reduce intake of dietary saturated fat and total fat, to limit sodium intake by avoiding high sodium foods and not adding table salt, and to maintain adequate dietary potassium and calcium preferably from fresh fruits, vegetables, and low-fat dairy products.    stressed the importance of regular exercise  Injury prevention: Discussed safety belts, safety helmets, smoke detector, smoking near bedding or upholstery.   Dental health: Discussed importance of regular tooth brushing, flossing, and dental visits.   Follow up plan: NEXT PREVENTATIVE PHYSICAL DUE IN 1 YEAR. Return in about 4 weeks (around 05/10/2019) for BP, smoking cessation f/u.

## 2019-04-12 NOTE — Assessment & Plan Note (Signed)
Discussed options, pt wanting to try zyban. Recommended habit replacement, avoiding temptations, and also utilizing patches, gums additionally.

## 2019-04-13 LAB — COMPREHENSIVE METABOLIC PANEL
ALT: 29 IU/L (ref 0–44)
AST: 24 IU/L (ref 0–40)
Albumin/Globulin Ratio: 1.4 (ref 1.2–2.2)
Albumin: 4.3 g/dL (ref 4.0–5.0)
Alkaline Phosphatase: 99 IU/L (ref 39–117)
BUN/Creatinine Ratio: 5 — ABNORMAL LOW (ref 9–20)
BUN: 6 mg/dL (ref 6–24)
Bilirubin Total: 0.5 mg/dL (ref 0.0–1.2)
CO2: 22 mmol/L (ref 20–29)
Calcium: 10 mg/dL (ref 8.7–10.2)
Chloride: 102 mmol/L (ref 96–106)
Creatinine, Ser: 1.25 mg/dL (ref 0.76–1.27)
GFR calc Af Amer: 82 mL/min/{1.73_m2} (ref >59)
GFR calc non Af Amer: 71 mL/min/{1.73_m2} (ref >59)
Globulin, Total: 3.1 g/dL (ref 1.5–4.5)
Glucose: 107 mg/dL — ABNORMAL HIGH (ref 65–99)
Potassium: 4.2 mmol/L (ref 3.5–5.2)
Sodium: 138 mmol/L (ref 134–144)
Total Protein: 7.4 g/dL (ref 6.0–8.5)

## 2019-04-13 LAB — CBC WITH DIFFERENTIAL/PLATELET
Basophils Absolute: 0 10*3/uL (ref 0.0–0.2)
Basos: 1 %
EOS (ABSOLUTE): 0 10*3/uL (ref 0.0–0.4)
Eos: 1 %
Hematocrit: 40.9 % (ref 37.5–51.0)
Hemoglobin: 13.8 g/dL (ref 13.0–17.7)
Immature Grans (Abs): 0 10*3/uL (ref 0.0–0.1)
Immature Granulocytes: 0 %
Lymphocytes Absolute: 2.4 10*3/uL (ref 0.7–3.1)
Lymphs: 45 %
MCH: 29.9 pg (ref 26.6–33.0)
MCHC: 33.7 g/dL (ref 31.5–35.7)
MCV: 89 fL (ref 79–97)
Monocytes Absolute: 0.4 10*3/uL (ref 0.1–0.9)
Monocytes: 8 %
Neutrophils Absolute: 2.4 10*3/uL (ref 1.4–7.0)
Neutrophils: 45 %
Platelets: 305 10*3/uL (ref 150–450)
RBC: 4.61 x10E6/uL (ref 4.14–5.80)
RDW: 14.8 % (ref 11.6–15.4)
WBC: 5.3 10*3/uL (ref 3.4–10.8)

## 2019-04-13 LAB — TESTOSTERONE, FREE, TOTAL, SHBG
Sex Hormone Binding: 36 nmol/L (ref 16.5–55.9)
Testosterone, Free: 13 pg/mL (ref 6.8–21.5)
Testosterone: 667 ng/dL (ref 264–916)

## 2019-04-13 LAB — LIPID PANEL W/O CHOL/HDL RATIO
Cholesterol, Total: 228 mg/dL — ABNORMAL HIGH (ref 100–199)
HDL: 31 mg/dL — ABNORMAL LOW (ref >39)
LDL Chol Calc (NIH): 166 mg/dL — ABNORMAL HIGH (ref 0–99)
Triglycerides: 165 mg/dL — ABNORMAL HIGH (ref 0–149)
VLDL Cholesterol Cal: 31 mg/dL (ref 5–40)

## 2019-04-13 LAB — TSH: TSH: 0.966 u[IU]/mL (ref 0.450–4.500)

## 2019-04-26 DIAGNOSIS — R109 Unspecified abdominal pain: Secondary | ICD-10-CM | POA: Diagnosis not present

## 2019-04-26 DIAGNOSIS — K5900 Constipation, unspecified: Secondary | ICD-10-CM | POA: Diagnosis not present

## 2019-04-26 DIAGNOSIS — R1084 Generalized abdominal pain: Secondary | ICD-10-CM | POA: Diagnosis not present

## 2019-05-14 ENCOUNTER — Encounter: Payer: Self-pay | Admitting: Family Medicine

## 2019-05-14 ENCOUNTER — Ambulatory Visit: Payer: BC Managed Care – PPO | Admitting: Family Medicine

## 2019-05-14 ENCOUNTER — Other Ambulatory Visit: Payer: Self-pay | Admitting: Family Medicine

## 2019-05-14 ENCOUNTER — Other Ambulatory Visit: Payer: Self-pay

## 2019-05-14 ENCOUNTER — Ambulatory Visit (INDEPENDENT_AMBULATORY_CARE_PROVIDER_SITE_OTHER): Payer: BC Managed Care – PPO | Admitting: Family Medicine

## 2019-05-14 VITALS — Wt 213.0 lb

## 2019-05-14 DIAGNOSIS — F17210 Nicotine dependence, cigarettes, uncomplicated: Secondary | ICD-10-CM

## 2019-05-14 DIAGNOSIS — I10 Essential (primary) hypertension: Secondary | ICD-10-CM | POA: Diagnosis not present

## 2019-05-14 DIAGNOSIS — N529 Male erectile dysfunction, unspecified: Secondary | ICD-10-CM | POA: Diagnosis not present

## 2019-05-14 NOTE — Assessment & Plan Note (Signed)
Has been very successful cutting back on smoking with the zyban, continue current regimen and efforts toward full cessation

## 2019-05-14 NOTE — Assessment & Plan Note (Signed)
Patient to check BP this evening when he gets home and send mychart message with reading, will adjust based on the results. Continue current regimen in meantime

## 2019-05-14 NOTE — Progress Notes (Signed)
Wt 213 lb (96.6 kg)   BMI 29.29 kg/m    Subjective:    Patient ID: Cody Aguilar, male    DOB: 1976/06/10, 43 y.o.   MRN: [id number]  HPI: Cody Aguilar is a 43 y.o. male  Chief Complaint  Patient presents with  . Hypertension  . Nicotine Dependence    . This visit was completed via WebEx due to the restrictions of the COVID-19 pandemic. All issues as above were discussed and addressed. Physical exam was done as above through visual confirmation on WebEx. If it was felt that the patient should be evaluated in the office, they were directed there. The patient verbally consented to this visit. . Location of the patient: work . Location of the provider: work . Those involved with this call:  . Provider: Roosvelt Maserachel Lane, PA-C . CMA: Elton SinAnita Quito, CMA . Front Desk/Registration: Harriet PhoJoliza Johnson  . Time spent on call: 25 minutes with patient face to face via video conference. More than 50% of this time was spent in counseling and coordination of care. 5 minutes total spent in review of patient's record and preparation of their chart. I verified patient identity using two factors (patient name and date of birth). Patient consents verbally to being seen via telemedicine visit today.   Presenting today for f/u on several issues.   Has cut way back on his smoking, only smoking just after meals now since the zyban. Feels it's helping quite a bit and tolerating well without side effects. Hoping to fully quit smoking here soon.  HTN - has monitor at home but has not been checking it. Does not have access at this time to a BP monitor. Tolerating medicines well, denies CP, SOB, HAs, dizziness. Trying to eat better and be more active.   Viagra working well prn for his ED he intermittently struggles with. No side effects reported.   Relevant past medical, surgical, family and social history reviewed and updated as indicated. Interim medical history since our last visit reviewed. Allergies  and medications reviewed and updated.  Review of Systems  Per HPI unless specifically indicated above     Objective:    Wt 213 lb (96.6 kg)   BMI 29.29 kg/m   Wt Readings from Last 3 Encounters:  05/14/19 213 lb (96.6 kg)  04/12/19 212 lb (96.2 kg)  02/23/19 206 lb (93.4 kg)    Physical Exam Vitals signs and nursing note reviewed.  Constitutional:      General: He is not in acute distress.    Appearance: Normal appearance.  HENT:     Head: Atraumatic.     Right Ear: External ear normal.     Left Ear: External ear normal.     Nose: Nose normal. No congestion.     Mouth/Throat:     Mouth: Mucous membranes are moist.     Pharynx: Oropharynx is clear.  Eyes:     Extraocular Movements: Extraocular movements intact.     Conjunctiva/sclera: Conjunctivae normal.  Neck:     Musculoskeletal: Normal range of motion.  Pulmonary:     Effort: Pulmonary effort is normal. No respiratory distress.  Musculoskeletal: Normal range of motion.  Skin:    General: Skin is dry.     Findings: No erythema or rash.  Neurological:     Mental Status: He is oriented to person, place, and time.  Psychiatric:        Mood and Affect: Mood normal.  Thought Content: Thought content normal.        Judgment: Judgment normal.     Results for orders placed or performed in visit on 04/12/19  Microscopic Examination   URINE  Result Value Ref Range   WBC, UA None seen 0 - 5 /hpf   RBC 0-2 0 - 2 /hpf   Epithelial Cells (non renal) 0-10 0 - 10 /hpf   Bacteria, UA None seen None seen/Few  CBC with Differential/Platelet  Result Value Ref Range   WBC 5.3 3.4 - 10.8 x10E3/uL   RBC 4.61 4.14 - 5.80 x10E6/uL   Hemoglobin 13.8 13.0 - 17.7 g/dL   Hematocrit 79.0 24.0 - 51.0 %   MCV 89 79 - 97 fL   MCH 29.9 26.6 - 33.0 pg   MCHC 33.7 31.5 - 35.7 g/dL   RDW 97.3 53.2 - 99.2 %   Platelets 305 150 - 450 x10E3/uL   Neutrophils 45 Not Estab. %   Lymphs 45 Not Estab. %   Monocytes 8 Not Estab. %    Eos 1 Not Estab. %   Basos 1 Not Estab. %   Neutrophils Absolute 2.4 1.4 - 7.0 x10E3/uL   Lymphocytes Absolute 2.4 0.7 - 3.1 x10E3/uL   Monocytes Absolute 0.4 0.1 - 0.9 x10E3/uL   EOS (ABSOLUTE) 0.0 0.0 - 0.4 x10E3/uL   Basophils Absolute 0.0 0.0 - 0.2 x10E3/uL   Immature Granulocytes 0 Not Estab. %   Immature Grans (Abs) 0.0 0.0 - 0.1 x10E3/uL  Comprehensive metabolic panel  Result Value Ref Range   Glucose 107 (H) 65 - 99 mg/dL   BUN 6 6 - 24 mg/dL   Creatinine, Ser 4.26 0.76 - 1.27 mg/dL   GFR calc non Af Amer 71 >59 mL/min/1.73   GFR calc Af Amer 82 >59 mL/min/1.73   BUN/Creatinine Ratio 5 (L) 9 - 20   Sodium 138 134 - 144 mmol/L   Potassium 4.2 3.5 - 5.2 mmol/L   Chloride 102 96 - 106 mmol/L   CO2 22 20 - 29 mmol/L   Calcium 10.0 8.7 - 10.2 mg/dL   Total Protein 7.4 6.0 - 8.5 g/dL   Albumin 4.3 4.0 - 5.0 g/dL   Globulin, Total 3.1 1.5 - 4.5 g/dL   Albumin/Globulin Ratio 1.4 1.2 - 2.2   Bilirubin Total 0.5 0.0 - 1.2 mg/dL   Alkaline Phosphatase 99 39 - 117 IU/L   AST 24 0 - 40 IU/L   ALT 29 0 - 44 IU/L  Lipid Panel w/o Chol/HDL Ratio  Result Value Ref Range   Cholesterol, Total 228 (H) 100 - 199 mg/dL   Triglycerides 834 (H) 0 - 149 mg/dL   HDL 31 (L) >19 mg/dL   VLDL Cholesterol Cal 31 5 - 40 mg/dL   LDL Chol Calc (NIH) 622 (H) 0 - 99 mg/dL  TSH  Result Value Ref Range   TSH 0.966 0.450 - 4.500 uIU/mL  UA/M w/rflx Culture, Routine   Specimen: Urine   URINE  Result Value Ref Range   Specific Gravity, UA <1.005 (L) 1.005 - 1.030   pH, UA 5.5 5.0 - 7.5   Color, UA Yellow Yellow   Appearance Ur Clear Clear   Leukocytes,UA Negative Negative   Protein,UA Negative Negative/Trace   Glucose, UA Negative Negative   Ketones, UA Negative Negative   RBC, UA 1+ (A) Negative   Bilirubin, UA Negative Negative   Urobilinogen, Ur 0.2 0.2 - 1.0 mg/dL   Nitrite, UA Negative Negative  Microscopic Examination See below:   Testosterone, Free, Total, SHBG  Result Value Ref  Range   Testosterone 667 264 - 916 ng/dL   Testosterone, Free 13.0 6.8 - 21.5 pg/mL   Sex Hormone Binding 36.0 16.5 - 55.9 nmol/L      Assessment & Plan:   Problem List Items Addressed This Visit      Cardiovascular and Mediastinum   Essential hypertension - Primary    Patient to check BP this evening when he gets home and send mychart message with reading, will adjust based on the results. Continue current regimen in meantime        Other   Cigarette smoker    Has been very successful cutting back on smoking with the zyban, continue current regimen and efforts toward full cessation       Other Visit Diagnoses    Erectile dysfunction, unspecified erectile dysfunction type       Under good control with prn viagra. Continue current regimen       Follow up plan: Return in about 6 months (around 11/11/2019) for 6 month f/u.

## 2019-05-16 MED ORDER — CYCLOBENZAPRINE HCL 10 MG PO TABS: 10.0000 mg | ORAL_TABLET | Freq: Three times a day (TID) | ORAL | 0 refills | Status: DC | PRN

## 2019-05-17 ENCOUNTER — Other Ambulatory Visit: Payer: Self-pay | Admitting: Family Medicine

## 2019-05-17 MED ORDER — LOSARTAN POTASSIUM-HCTZ 100-25 MG PO TABS: 1.0000 | ORAL_TABLET | Freq: Every day | ORAL | 0 refills | Status: DC

## 2019-06-02 ENCOUNTER — Other Ambulatory Visit: Payer: Self-pay | Admitting: Family Medicine

## 2019-06-04 ENCOUNTER — Other Ambulatory Visit: Payer: Self-pay | Admitting: Family Medicine

## 2019-06-04 ENCOUNTER — Encounter: Payer: Self-pay | Admitting: Family Medicine

## 2019-06-04 MED ORDER — SILDENAFIL CITRATE 100 MG PO TABS: 100.0000 mg | ORAL_TABLET | Freq: Every day | ORAL | 0 refills | Status: DC | PRN

## 2019-06-05 ENCOUNTER — Other Ambulatory Visit: Payer: Self-pay | Admitting: Family Medicine

## 2019-06-05 MED ORDER — SILDENAFIL CITRATE 100 MG PO TABS: 100.0000 mg | ORAL_TABLET | Freq: Every day | ORAL | 3 refills | Status: DC | PRN

## 2019-06-14 ENCOUNTER — Other Ambulatory Visit: Payer: Self-pay | Admitting: Family Medicine

## 2019-06-14 NOTE — Telephone Encounter (Signed)
Requested medication (s) are due for refill today: yes  Requested medication (s) are on the active medication list: yes  Last refill:  04/12/2019  Future visit scheduled: no  Notes to clinic:  antidepressants - bupropion failed  Requested Prescriptions  Pending Prescriptions Disp Refills   buPROPion (ZYBAN) 150 MG 12 hr tablet [Pharmacy Med Name: BUPROPION HCL SR 150 MG TABLET] 60 tablet 0    Sig: TAKE 1 TABLET BY MOUTH TWICE A DAY      Psychiatry: Antidepressants - bupropion Failed - 06/14/2019  3:52 PM      Failed - Last BP in normal range    BP Readings from Last 1 Encounters:  04/12/19 (!) 139/101          Passed - Valid encounter within last 6 months    Recent Outpatient Visits           1 month ago Essential hypertension   [facility] [place], [place]   2 months ago Essential hypertension   [place], [place], [place]   3 months ago Acute bilateral low back pain with bilateral sciatica   [facility] [place], [place]   8 months ago Essential hypertension   [place], [place], [place]   9 months ago Essential hypertension   [place], [place], [place]

## 2019-06-17 ENCOUNTER — Other Ambulatory Visit: Payer: Self-pay | Admitting: Family Medicine

## 2019-06-18 NOTE — Telephone Encounter (Signed)
Routing to provider  

## 2019-07-13 ENCOUNTER — Ambulatory Visit: Payer: BC Managed Care – PPO | Attending: Internal Medicine

## 2019-07-13 DIAGNOSIS — Z20822 Contact with and (suspected) exposure to covid-19: Secondary | ICD-10-CM

## 2019-07-14 LAB — NOVEL CORONAVIRUS, NAA: SARS-CoV-2, NAA: NOT DETECTED

## 2019-10-07 ENCOUNTER — Other Ambulatory Visit: Payer: Self-pay | Admitting: Family Medicine

## 2019-10-08 NOTE — Telephone Encounter (Signed)
Routing to provider  

## 2019-10-25 DIAGNOSIS — Z20822 Contact with and (suspected) exposure to covid-19: Secondary | ICD-10-CM | POA: Diagnosis not present

## 2019-10-25 DIAGNOSIS — J069 Acute upper respiratory infection, unspecified: Secondary | ICD-10-CM | POA: Diagnosis not present

## 2019-11-03 ENCOUNTER — Other Ambulatory Visit: Payer: Self-pay | Admitting: Family Medicine

## 2020-01-04 ENCOUNTER — Other Ambulatory Visit: Payer: Self-pay | Admitting: Family Medicine

## 2020-01-04 NOTE — Telephone Encounter (Signed)
Requested  medications are  due for refill today yes  Requested medications are on the active medication list yes  Last refill 6/22  Last visit 04/2019  Future visit scheduled NO  Notes to clinic Failed protocol of visit within 6 months and there is no visit scheduled.

## 2020-01-04 NOTE — Telephone Encounter (Signed)
See message below °

## 2020-01-14 DIAGNOSIS — F17200 Nicotine dependence, unspecified, uncomplicated: Secondary | ICD-10-CM | POA: Diagnosis not present

## 2020-01-14 DIAGNOSIS — R319 Hematuria, unspecified: Secondary | ICD-10-CM | POA: Diagnosis not present

## 2020-01-14 DIAGNOSIS — I10 Essential (primary) hypertension: Secondary | ICD-10-CM | POA: Diagnosis not present

## 2020-01-14 DIAGNOSIS — M549 Dorsalgia, unspecified: Secondary | ICD-10-CM | POA: Diagnosis not present

## 2020-01-14 DIAGNOSIS — M545 Low back pain: Secondary | ICD-10-CM | POA: Diagnosis not present

## 2020-01-14 DIAGNOSIS — N281 Cyst of kidney, acquired: Secondary | ICD-10-CM | POA: Diagnosis not present

## 2020-01-14 DIAGNOSIS — R7303 Prediabetes: Secondary | ICD-10-CM | POA: Diagnosis not present

## 2020-01-14 DIAGNOSIS — Z79891 Long term (current) use of opiate analgesic: Secondary | ICD-10-CM | POA: Diagnosis not present

## 2020-01-14 DIAGNOSIS — R109 Unspecified abdominal pain: Secondary | ICD-10-CM | POA: Diagnosis not present

## 2020-01-30 DIAGNOSIS — Z20822 Contact with and (suspected) exposure to covid-19: Secondary | ICD-10-CM | POA: Diagnosis not present

## 2020-01-30 DIAGNOSIS — J069 Acute upper respiratory infection, unspecified: Secondary | ICD-10-CM | POA: Diagnosis not present

## 2020-02-13 DIAGNOSIS — Z20822 Contact with and (suspected) exposure to covid-19: Secondary | ICD-10-CM | POA: Diagnosis not present

## 2020-02-13 DIAGNOSIS — Z03818 Encounter for observation for suspected exposure to other biological agents ruled out: Secondary | ICD-10-CM | POA: Diagnosis not present

## 2020-02-13 DIAGNOSIS — Z1152 Encounter for screening for COVID-19: Secondary | ICD-10-CM | POA: Diagnosis not present

## 2020-03-11 DIAGNOSIS — N281 Cyst of kidney, acquired: Secondary | ICD-10-CM | POA: Diagnosis not present

## 2020-03-11 DIAGNOSIS — R319 Hematuria, unspecified: Secondary | ICD-10-CM | POA: Diagnosis not present

## 2020-03-11 DIAGNOSIS — Z6828 Body mass index (BMI) 28.0-28.9, adult: Secondary | ICD-10-CM | POA: Diagnosis not present

## 2020-04-17 DIAGNOSIS — Z03818 Encounter for observation for suspected exposure to other biological agents ruled out: Secondary | ICD-10-CM | POA: Diagnosis not present

## 2020-04-17 DIAGNOSIS — Z20822 Contact with and (suspected) exposure to covid-19: Secondary | ICD-10-CM | POA: Diagnosis not present

## 2020-04-17 DIAGNOSIS — Z1152 Encounter for screening for COVID-19: Secondary | ICD-10-CM | POA: Diagnosis not present

## 2020-04-22 DIAGNOSIS — J069 Acute upper respiratory infection, unspecified: Secondary | ICD-10-CM | POA: Diagnosis not present

## 2020-04-22 DIAGNOSIS — Z03818 Encounter for observation for suspected exposure to other biological agents ruled out: Secondary | ICD-10-CM | POA: Diagnosis not present

## 2020-07-01 ENCOUNTER — Other Ambulatory Visit: Payer: Self-pay | Admitting: Family Medicine

## 2020-07-01 NOTE — Telephone Encounter (Signed)
Pt has apt on 07/17/2020 pt verbalized understanding.

## 2020-07-17 ENCOUNTER — Ambulatory Visit: Payer: BC Managed Care – PPO | Admitting: Nurse Practitioner

## 2020-07-17 ENCOUNTER — Encounter: Payer: Self-pay | Admitting: Nurse Practitioner

## 2020-07-17 ENCOUNTER — Other Ambulatory Visit: Payer: Self-pay

## 2020-07-17 VITALS — BP 178/112 | HR 84 | Temp 99.0°F | Wt 213.0 lb

## 2020-07-17 DIAGNOSIS — R0600 Dyspnea, unspecified: Secondary | ICD-10-CM | POA: Diagnosis not present

## 2020-07-17 DIAGNOSIS — F17210 Nicotine dependence, cigarettes, uncomplicated: Secondary | ICD-10-CM | POA: Diagnosis not present

## 2020-07-17 DIAGNOSIS — R0609 Other forms of dyspnea: Secondary | ICD-10-CM | POA: Insufficient documentation

## 2020-07-17 DIAGNOSIS — I10 Essential (primary) hypertension: Secondary | ICD-10-CM

## 2020-07-17 DIAGNOSIS — R079 Chest pain, unspecified: Secondary | ICD-10-CM | POA: Diagnosis not present

## 2020-07-17 MED ORDER — AMLODIPINE BESYLATE 10 MG PO TABS: 10.0000 mg | ORAL_TABLET | Freq: Every day | ORAL | 1 refills | Status: DC

## 2020-07-17 NOTE — Progress Notes (Signed)
Ekg interpreted by me on 07/17/20. Showed NSR 70 bpm with LVH and no ST changes.

## 2020-07-17 NOTE — Progress Notes (Signed)
I saw and evaluated the above patient on 07/17/20.  The case was discussed on rounds with Lauren McElwee, DNP. I personally reviewed the HPI, PH, FH, SH, ROS and medications. I repeated pertinent portions of the examination and reviewed the relevant imaging and laboratory data. I agree with the findings, assessment and plan as documented.   

## 2020-07-17 NOTE — Assessment & Plan Note (Addendum)
Chronic, at goal. He is currently not taking any medications due to feeling tired and sluggish while taking them. He was prescribed 3 medications at the time. Will restart amplodipine 10mg  daily and follow-up in 1 week. Encouraged him to check his blood pressure at home daily and bring results to next visit. Dizziness could be related to his elevated blood pressures. Will check BMP, CBC, and lipids today

## 2020-07-17 NOTE — Assessment & Plan Note (Signed)
No signs of fluid overload on exam. Will restart amlodipine, refer to cardiology and follow-up in 1 week. Check CBC and TSH.

## 2020-07-17 NOTE — Assessment & Plan Note (Addendum)
Currently chest pain free. Having intermittent chest cramping. EKG obtained. Showed NSR with LVH and no ST changes. Start aspirin 81mg  daily. Will refer to cardiology and follow-up in 1 week. If he has any recurrent chest pain call 911 and go to the ER.

## 2020-07-17 NOTE — Assessment & Plan Note (Signed)
Smokes 1ppd. Has tried in the past to quit, however not interested at this time.

## 2020-07-17 NOTE — Patient Instructions (Addendum)
Start taking a baby aspirin 81mg  daily. If having any more chest pain, call 911 and go to the ER. Nonspecific Chest Pain Chest pain can be caused by many different conditions. Some causes of chest pain can be life-threatening. These will require treatment right away. Serious causes of chest pain include:  Heart attack.  A tear in the body's main blood vessel.  Redness and swelling (inflammation) around your heart.  Blood clot in your lungs. Other causes of chest pain may not be so serious. These include:  Heartburn.  Anxiety or stress.  Damage to bones or muscles in your chest.  Lung infections. Chest pain can feel like:  Pain or discomfort in your chest.  Crushing, pressure, aching, or squeezing pain.  Burning or tingling.  Dull or sharp pain that is worse when you move, cough, or take a deep breath.  Pain or discomfort that is also felt in your back, neck, jaw, shoulder, or arm, or pain that spreads to any of these areas. It is hard to know whether your pain is caused by something that is serious or something that is not so serious. So it is important to see your doctor right away if you have chest pain. Follow these instructions at home: Medicines  Take over-the-counter and prescription medicines only as told by your doctor.  If you were prescribed an antibiotic medicine, take it as told by your doctor. Do not stop taking the antibiotic even if you start to feel better. Lifestyle  Rest as told by your doctor.  Do not use any products that contain nicotine or tobacco, such as cigarettes, e-cigarettes, and chewing tobacco. If you need help quitting, ask your doctor.  Do not drink alcohol.  Make lifestyle changes as told by your doctor. These may include: ? Getting regular exercise. Ask your doctor what activities are safe for you. ? Eating a heart-healthy diet. A diet and nutrition specialist (dietitian) can help you to learn healthy eating options. ? Staying at a  healthy weight. ? Treating diabetes or high blood pressure, if needed. ? Lowering your stress. Activities such as yoga and relaxation techniques can help.   General instructions  Pay attention to any changes in your symptoms. Tell your doctor about them or any new symptoms.  Avoid any activities that cause chest pain.  Keep all follow-up visits as told by your doctor. This is important. You may need more testing if your chest pain does not go away. Contact a doctor if:  Your chest pain does not go away.  You feel depressed.  You have a fever. Get help right away if:  Your chest pain is worse.  You have a cough that gets worse, or you cough up blood.  You have very bad (severe) pain in your belly (abdomen).  You pass out (faint).  You have either of these for no clear reason: ? Sudden chest discomfort. ? Sudden discomfort in your arms, back, neck, or jaw.  You have shortness of breath at any time.  You suddenly start to sweat, or your skin gets clammy.  You feel sick to your stomach (nauseous).  You throw up (vomit).  You suddenly feel lightheaded or dizzy.  You feel very weak or tired.  Your heart starts to beat fast, or it feels like it is skipping beats. These symptoms may be an emergency. Do not wait to see if the symptoms will go away. Get medical help right away. Call your local emergency services (911 in  the [place] Do not drive yourself to the hospital. Summary  Chest pain can be caused by many different conditions. The cause may be serious and need treatment right away. If you have chest pain, see your doctor right away.  Follow your doctor's instructions for taking medicines and making lifestyle changes.  Keep all follow-up visits as told by your doctor. This includes visits for any further testing if your chest pain does not go away.  Be sure to know the signs that show that your condition has become worse. Get help right away if you have these  symptoms. This information is not intended to replace advice given to you by your health care provider. Make sure you discuss any questions you have with your health care provider. Document Revised: 12/01/2017 Document Reviewed: 12/01/2017 Elsevier Patient Education  2021 [employer].

## 2020-07-17 NOTE — Progress Notes (Signed)
Established Patient Office Visit  Subjective:  Patient ID: Cody Aguilar, male    DOB: 1976/01/14  Age: 45 y.o. MRN: [id number]  CC:  Chief Complaint  Patient presents with  . Hypertension    Follow up today, needs medication refills   . Chest Pain    Patient states a week ago he was laying down and began to feel his heart was racing, chest cramps, both arms and hands felt like they were tingling. Since then has been feeling short of breath and dizzy when he walks.     HPI Cody Aguilar presents for hypertension, intermittent chest pain, shortness of breath on exertion, and dizziness.   Last Wednesday came home from work and was laying on couch. He got short of breath and had chest cramping for 1.5 hours. For a couple days after cramping, he was sore around his chest. Finger tips were tingling at the same time as the chest cramping. Now happening for short intermittent episodes.    CHEST PAIN   Time since onset: Duration:days Onset: sudden Quality: cramping Severity: 7/10 Location: left para substernal Radiation: none Episode duration: intermittent, initial episode lasted 1.5 hours Frequency: intermittent Related to exertion: yes , when bend over starts cramping and gets really dizzy Activity when pain started: Laying down Trauma: no Anxiety/recent stressors: no Aggravating factors: None Alleviating factors: Time Status: fluctuating Treatments attempted: nothing  Current pain status: pain free Shortness of breath: no Cough: yes, non-productive--baseline Nausea: no Diaphoresis: yes  With first episode Heartburn: no Palpitations: yes--baseline for years  SHORTNESS OF BREATH  Duration: Started Wednesday after having chest cramping. Now he is having shortness of breath on exertion. Onset: sudden Description of breathing discomfort: Out of breath walking to parking lot or walking at work Severity: mild Episode duration: Goes away with rest Frequency: With  every exertion Related to exertion: yes Cough: yes non-productive Chest tightness: no Wheezing: no Fevers: no Chest pain: yes--see above, not with shortness of breath Palpitations: yes --baseline for years Nausea: no Diaphoresis: no Deconditioning: no Status: stable Aggravating factors: exertion Alleviating factors: Rest Treatments attempted: None  HYPERTENSION  Has not taken his medications in 1-1.5 years Hypertension status: uncontrolled  Satisfied with current treatment? no --when takes medications he feels tired and dragging Duration of hypertension: chronic BP monitoring frequency:  weekly BP range: Diastolic in the 932I BP medication side effects:  yes --tiredness Medication compliance: poor compliance Previous BP meds: Amlodipine, losartan-hctz Aspirin: no Recurrent headaches: no Visual changes: no Dizzy/lightheaded: yes--happens with movement. Does not notice when rolling over in bed. Gets better when laying down and relaxing. Mainly happens when bending over and he needs to stop what he is doing and stand still for it to resolve. Does not feel syncopal.   Past Medical History:  Diagnosis Date  . High cholesterol     Past Surgical History:  Procedure Laterality Date  . hemorrhoid removal      Family History  Problem Relation Age of Onset  . Hypertension Mother   . Diabetes Mother   . Heart disease Mother   . Multiple sclerosis Mother   . Hypertension Father   . Hypertension Brother   . Heart disease Maternal Grandmother   . Heart disease Maternal Grandfather     Social History   Socioeconomic History  . Marital status: Married    Spouse name: Not on file  . Number of children: Not on file  . Years of education: Not on file  .  Highest education level: Not on file  Occupational History  . Not on file  Tobacco Use  . Smoking status: Current Every Day Smoker    Packs/day: 1.00    Types: Cigarettes  . Smokeless tobacco: Never Used  Vaping Use   . Vaping Use: Former  Substance and Sexual Activity  . Alcohol use: No  . Drug use: No  . Sexual activity: Yes  Other Topics Concern  . Not on file  Social History Narrative  . Not on file   Social Determinants of Health   Financial Resource Strain: Not on file  Food Insecurity: Not on file  Transportation Needs: Not on file  Physical Activity: Not on file  Stress: Not on file  Social Connections: Not on file  Intimate Partner Violence: Not on file    Outpatient Medications Prior to Visit  Medication Sig Dispense Refill  . sildenafil (VIAGRA) 100 MG tablet Take 1 tablet (100 mg total) by mouth daily as needed for erectile dysfunction. 30 tablet 3  . losartan-hydrochlorothiazide (HYZAAR) 100-25 MG tablet TAKE 1 TABLET BY MOUTH EVERY DAY (Patient not taking: Reported on 07/17/2020) 30 tablet 0  . amLODipine (NORVASC) 10 MG tablet Take 1 tablet (10 mg total) by mouth daily. (Patient not taking: Reported on 07/17/2020) 90 tablet 1  . buPROPion (ZYBAN) 150 MG 12 hr tablet TAKE 1 TABLET BY MOUTH TWICE A DAY (Patient not taking: Reported on 07/17/2020) 60 tablet 0  . cyclobenzaprine (FLEXERIL) 10 MG tablet TAKE 1 TABLET BY MOUTH THREE TIMES A DAY AS NEEDED FOR MUSCLE SPASMS (Patient not taking: Reported on 07/17/2020) 30 tablet 0  . traZODone (DESYREL) 50 MG tablet TAKE 1/2 TO 1 TABLET (25-50 MG TOTAL) BY MOUTH AT BEDTIME AS NEEDED FOR SLEEP. (Patient not taking: Reported on 07/17/2020) 14 tablet 0   No facility-administered medications prior to visit.    Allergies  Allergen Reactions  . Oxymetazoline Swelling    Pt. Reports when he has tried to use any OTC nasal spray his face swells    ROS Review of Systems  Constitutional: Positive for fatigue.  HENT: Negative for congestion, ear pain and sinus pressure.   Eyes: Negative.   Respiratory: Positive for shortness of breath.   Cardiovascular: Positive for chest pain (intermittent cramping).  Gastrointestinal: Negative.   Endocrine:  Positive for polyuria. Negative for polydipsia and polyphagia.  Musculoskeletal: Negative.   Skin: Negative.   Allergic/Immunologic: Negative.   Neurological: Positive for dizziness. Negative for light-headedness.  Hematological: Negative.      Objective:    Physical Exam Vitals and nursing note reviewed.  Constitutional:      General: He is not in acute distress.    Appearance: Normal appearance. He is not toxic-appearing.  HENT:     Head: Normocephalic and atraumatic.  Eyes:     Conjunctiva/sclera: Conjunctivae normal.  Neck:     Vascular: No carotid bruit or JVD.  Cardiovascular:     Rate and Rhythm: Normal rate and regular rhythm.     Pulses: Normal pulses.     Heart sounds: Normal heart sounds.  Pulmonary:     Effort: Pulmonary effort is normal.     Breath sounds: Normal breath sounds.  Abdominal:     General: There is no distension.     Palpations: Abdomen is soft.  Musculoskeletal:        General: Normal range of motion.     Cervical back: Normal range of motion.     Right lower leg:  No edema.     Left lower leg: No edema.  Skin:    General: Skin is warm and dry.  Neurological:     General: No focal deficit present.     Mental Status: He is alert and oriented to person, place, and time.  Psychiatric:        Mood and Affect: Mood normal.        Behavior: Behavior normal.    BP (!) 178/112 (BP Location: Left Arm, Patient Position: Sitting)   Pulse 84   Temp 99 F (37.2 C)   Wt 213 lb (96.6 kg)   SpO2 97%   BMI 29.29 kg/m  Wt Readings from Last 3 Encounters:  07/17/20 213 lb (96.6 kg)  05/14/19 213 lb (96.6 kg)  04/12/19 212 lb (96.2 kg)    Lab Results  Component Value Date   TSH 0.966 04/12/2019   Lab Results  Component Value Date   WBC 5.3 04/12/2019   HGB 13.8 04/12/2019   HCT 40.9 04/12/2019   MCV 89 04/12/2019   PLT 305 04/12/2019   Lab Results  Component Value Date   NA 138 04/12/2019   K 4.2 04/12/2019   CO2 22 04/12/2019    GLUCOSE 107 (H) 04/12/2019   BUN 6 04/12/2019   CREATININE 1.25 04/12/2019   BILITOT 0.5 04/12/2019   ALKPHOS 99 04/12/2019   AST 24 04/12/2019   ALT 29 04/12/2019   PROT 7.4 04/12/2019   ALBUMIN 4.3 04/12/2019   CALCIUM 10.0 04/12/2019   ANIONGAP 6 10/21/2017   Lab Results  Component Value Date   CHOL 228 (H) 04/12/2019   Lab Results  Component Value Date   HDL 31 (L) 04/12/2019   Lab Results  Component Value Date   LDLCALC 166 (H) 04/12/2019   Lab Results  Component Value Date   TRIG 165 (H) 04/12/2019      Assessment & Plan:   Problem List Items Addressed This Visit      Cardiovascular and Mediastinum   Essential hypertension    Chronic, at goal. He is currently not taking any medications due to feeling tired and sluggish while taking them. He was prescribed 3 medications at the time. Will restart amplodipine 38m daily and follow-up in 1 week. Encouraged him to check his blood pressure at home daily and bring results to next visit. Dizziness could be related to his elevated blood pressures. Will check BMP, CBC, and lipids today       Relevant Medications   amLODipine (NORVASC) 10 MG tablet   Other Relevant Orders   Comp Met (CMET)   CBC w/Diff   Lipid Panel w/o Chol/HDL Ratio   TSH   Ambulatory referral to Cardiology     Other   Cigarette smoker    Smokes 1ppd. Has tried in the past to quit, however not interested at this time.       Relevant Orders   Lipid Panel w/o Chol/HDL Ratio   Dyspnea on exertion    No signs of fluid overload on exam. Will restart amlodipine, refer to cardiology and follow-up in 1 week. Check CBC and TSH.       Relevant Orders   CBC w/Diff   TSH   Ambulatory referral to Cardiology   Chest pain - Primary    Currently chest pain free. Having intermittent chest cramping. EKG obtained. Showed NSR with LVH and no ST changes. Start aspirin 8102mdaily. Will refer to cardiology and follow-up in 1 week. If  he has any recurrent chest  pain call 911 and go to the ER.       Relevant Orders   EKG 12-Lead (Completed)   Ambulatory referral to Cardiology      Meds ordered this encounter  Medications  . amLODipine (NORVASC) 10 MG tablet    Sig: Take 1 tablet (10 mg total) by mouth daily.    Dispense:  90 tablet    Refill:  1    Follow-up: Return in about 1 week (around 07/24/2020) for blood pressure.    Charyl Dancer, NP

## 2020-07-18 ENCOUNTER — Ambulatory Visit: Payer: BC Managed Care – PPO | Admitting: Cardiology

## 2020-07-18 ENCOUNTER — Encounter: Payer: Self-pay | Admitting: Cardiology

## 2020-07-18 VITALS — BP 162/26 | HR 74 | Ht 73.0 in | Wt 213.0 lb

## 2020-07-18 DIAGNOSIS — I10 Essential (primary) hypertension: Secondary | ICD-10-CM

## 2020-07-18 DIAGNOSIS — F17200 Nicotine dependence, unspecified, uncomplicated: Secondary | ICD-10-CM

## 2020-07-18 DIAGNOSIS — E7800 Pure hypercholesterolemia, unspecified: Secondary | ICD-10-CM

## 2020-07-18 DIAGNOSIS — R072 Precordial pain: Secondary | ICD-10-CM

## 2020-07-18 LAB — COMPREHENSIVE METABOLIC PANEL
ALT: 27 IU/L (ref 0–44)
AST: 23 IU/L (ref 0–40)
Albumin/Globulin Ratio: 1.5 (ref 1.2–2.2)
Albumin: 4.7 g/dL (ref 4.0–5.0)
Alkaline Phosphatase: 96 IU/L (ref 44–121)
BUN/Creatinine Ratio: 5 — ABNORMAL LOW (ref 9–20)
BUN: 6 mg/dL (ref 6–24)
Bilirubin Total: 0.4 mg/dL (ref 0.0–1.2)
CO2: 23 mmol/L (ref 20–29)
Calcium: 9.9 mg/dL (ref 8.7–10.2)
Chloride: 103 mmol/L (ref 96–106)
Creatinine, Ser: 1.29 mg/dL — ABNORMAL HIGH (ref 0.76–1.27)
GFR calc Af Amer: 79 mL/min/{1.73_m2} (ref >59)
GFR calc non Af Amer: 68 mL/min/{1.73_m2} (ref >59)
Globulin, Total: 3.2 g/dL (ref 1.5–4.5)
Glucose: 73 mg/dL (ref 65–99)
Potassium: 4.3 mmol/L (ref 3.5–5.2)
Sodium: 141 mmol/L (ref 134–144)
Total Protein: 7.9 g/dL (ref 6.0–8.5)

## 2020-07-18 LAB — CBC WITH DIFFERENTIAL/PLATELET
Basophils Absolute: 0 10*3/uL (ref 0.0–0.2)
Basos: 1 %
EOS (ABSOLUTE): 0.1 10*3/uL (ref 0.0–0.4)
Eos: 1 %
Hematocrit: 44.2 % (ref 37.5–51.0)
Hemoglobin: 14.6 g/dL (ref 13.0–17.7)
Immature Grans (Abs): 0 10*3/uL (ref 0.0–0.1)
Immature Granulocytes: 0 %
Lymphocytes Absolute: 2.8 10*3/uL (ref 0.7–3.1)
Lymphs: 38 %
MCH: 29.4 pg (ref 26.6–33.0)
MCHC: 33 g/dL (ref 31.5–35.7)
MCV: 89 fL (ref 79–97)
Monocytes Absolute: 0.6 10*3/uL (ref 0.1–0.9)
Monocytes: 8 %
Neutrophils Absolute: 3.9 10*3/uL (ref 1.4–7.0)
Neutrophils: 52 %
Platelets: 309 10*3/uL (ref 150–450)
RBC: 4.97 x10E6/uL (ref 4.14–5.80)
RDW: 15 % (ref 11.6–15.4)
WBC: 7.3 10*3/uL (ref 3.4–10.8)

## 2020-07-18 LAB — LIPID PANEL W/O CHOL/HDL RATIO
Cholesterol, Total: 238 mg/dL — ABNORMAL HIGH (ref 100–199)
HDL: 30 mg/dL — ABNORMAL LOW (ref >39)
LDL Chol Calc (NIH): 177 mg/dL — ABNORMAL HIGH (ref 0–99)
Triglycerides: 168 mg/dL — ABNORMAL HIGH (ref 0–149)
VLDL Cholesterol Cal: 31 mg/dL (ref 5–40)

## 2020-07-18 LAB — TSH: TSH: 1.2 u[IU]/mL (ref 0.450–4.500)

## 2020-07-18 MED ORDER — AMLODIPINE BESYLATE 10 MG PO TABS: 10.0000 mg | ORAL_TABLET | Freq: Every day | ORAL | 5 refills | Status: DC

## 2020-07-18 MED ORDER — METOPROLOL TARTRATE 100 MG PO TABS: 100.0000 mg | ORAL_TABLET | Freq: Once | ORAL | 0 refills | Status: DC

## 2020-07-18 MED ORDER — ATORVASTATIN CALCIUM 40 MG PO TABS: 40.0000 mg | ORAL_TABLET | Freq: Every day | ORAL | 5 refills | Status: AC

## 2020-07-18 NOTE — Progress Notes (Signed)
Cardiology Office Note:    Date:  07/18/2020   ID:  Cody Aguilar, DOB January 13, 1976, MRN [id number]  PCP:  Jon Billings, NP  [facility] Cardiologist:  No primary care provider on file.  [facility] Electrophysiologist:  None   Referring MD: Charyl Dancer, NP   Chief Complaint  Patient presents with  . NEW patient-evaluate chest pain radiating down left arm/hand   Cody Aguilar is a 45 y.o. male who is being seen today for the evaluation of chest pain at the request of McElwee, Lauren A, NP.   History of Present Illness:    Cody Aguilar is a 45 y.o. male with a hx of hypertension, hyperlipidemia, current smoker x15 years who presents due to chest pain.  Patient states having on and off chest pain for some time now, this worsened over the past week where he had chest tightness while sitting at home.  Described chest discomfort as crampy, lasting 30 minutes.  He has also noticed chest discomfort sometimes with activity.  He usually sits down and tries to relax to help with symptoms.  He has a history of hypertension, did not follow-up with primary care physician, stopped taking BP medications.  Was started on Norvasc 10 mg daily which he is yet to pick up today.  His mother had a heart attack in her 23s.  Past Medical History:  Diagnosis Date  . High cholesterol     Past Surgical History:  Procedure Laterality Date  . COLONOSCOPY    . hemorrhoid removal      Current Medications: Current Meds  Medication Sig  . atorvastatin (LIPITOR) 40 MG tablet Take 1 tablet (40 mg total) by mouth daily.  . metoprolol tartrate (LOPRESSOR) 100 MG tablet Take 1 tablet (100 mg total) by mouth once for 1 dose. Take 2 hours prior to your CT scan.  . sildenafil (VIAGRA) 100 MG tablet Take 1 tablet (100 mg total) by mouth daily as needed for erectile dysfunction.     Allergies:   Oxymetazoline   Social History   Socioeconomic History  . Marital status: Married     Spouse name: Not on file  . Number of children: Not on file  . Years of education: Not on file  . Highest education level: Not on file  Occupational History  . Not on file  Tobacco Use  . Smoking status: Current Every Day Smoker    Packs/day: 1.00    Types: Cigarettes  . Smokeless tobacco: Never Used  Vaping Use  . Vaping Use: Former  Substance and Sexual Activity  . Alcohol use: No  . Drug use: No  . Sexual activity: Yes  Other Topics Concern  . Not on file  Social History Narrative  . Not on file   Social Determinants of Health   Financial Resource Strain: Not on file  Food Insecurity: Not on file  Transportation Needs: Not on file  Physical Activity: Not on file  Stress: Not on file  Social Connections: Not on file     Family History: The patient's family history includes Diabetes in his mother; Heart disease in his maternal grandfather, maternal grandmother, and mother; Hypertension in his brother, father, and mother; Multiple sclerosis in his mother.  ROS:   Please see the history of present illness.     All other systems reviewed and are negative.  EKGs/Labs/Other Studies Reviewed:    The following studies were reviewed today:   EKG:  EKG is  ordered today.  The ekg ordered today demonstrates normal sinus rhythm  Recent Labs: 07/17/2020: ALT 27; BUN 6; Creatinine, Ser 1.29; Hemoglobin 14.6; Platelets 309; Potassium 4.3; Sodium 141; TSH 1.200  Recent Lipid Panel    Component Value Date/Time   CHOL 238 (H) 07/17/2020 1138   TRIG 168 (H) 07/17/2020 1138   HDL 30 (L) 07/17/2020 1138   LDLCALC 177 (H) 07/17/2020 1138     Risk Assessment/Calculations:      Physical Exam:    VS:  BP (!) 162/26 (BP Location: Right Arm, Patient Position: Sitting, Cuff Size: Large)   Pulse 74   Ht 6' 1"  (1.854 m)   Wt 213 lb (96.6 kg)   SpO2 98%   BMI 28.10 kg/m     Wt Readings from Last 3 Encounters:  07/18/20 213 lb (96.6 kg)  07/17/20 213 lb (96.6 kg)  05/14/19  213 lb (96.6 kg)     GEN:  Well nourished, well developed in no acute distress HEENT: Normal NECK: No JVD; No carotid bruits LYMPHATICS: No lymphadenopathy CARDIAC: RRR, no murmurs, rubs, gallops RESPIRATORY:  Clear to auscultation without rales, wheezing or rhonchi  ABDOMEN: Soft, non-tender, non-distended MUSCULOSKELETAL:  No edema; No deformity  SKIN: Warm and dry NEUROLOGIC:  Alert and oriented x 3 PSYCHIATRIC:  Normal affect   ASSESSMENT:    1. Precordial pain   2. Primary hypertension   3. Pure hypercholesterolemia   4. Smoking    PLAN:    In order of problems listed above:  1. Chest pain, symptoms concerning for angina.  Risk factors of hypertension, hyperlipidemia, current smoker, family history of early CAD.  Get echocardiogram, obtain coronary CTA.  Advised patient to decrease activity at work to mild/moderate while cardiac testing is being performed. 2. Hypertension, BP elevated, start Norvasc 10 mg daily. 3. Hyperlipidemia, 10-year ASCVD risk 11%.  Start Lipitor 40 mg daily. 4. Current smoker, cessation advised.  Follow-up after echo and coronary CTA.    Medication Adjustments/Labs and Tests Ordered: Current medicines are reviewed at length with the patient today.  Concerns regarding medicines are outlined above.  Orders Placed This Encounter  Procedures  . CT CORONARY MORPH W/CTA COR W/SCORE W/CA W/CM &/OR WO/CM  . CT CORONARY FRACTIONAL FLOW RESERVE DATA PREP  . CT CORONARY FRACTIONAL FLOW RESERVE FLUID ANALYSIS  . EKG 12-Lead  . ECHOCARDIOGRAM COMPLETE   Meds ordered this encounter  Medications  . amLODipine (NORVASC) 10 MG tablet    Sig: Take 1 tablet (10 mg total) by mouth daily.    Dispense:  30 tablet    Refill:  5  . atorvastatin (LIPITOR) 40 MG tablet    Sig: Take 1 tablet (40 mg total) by mouth daily.    Dispense:  30 tablet    Refill:  5  . metoprolol tartrate (LOPRESSOR) 100 MG tablet    Sig: Take 1 tablet (100 mg total) by mouth once for  1 dose. Take 2 hours prior to your CT scan.    Dispense:  1 tablet    Refill:  0    Patient Instructions  Medication Instructions:  Your physician has recommended you make the following change in your medication:   1.  START Amlodipine 10 MG daily.  2.  START Lipitor 40 MG daily.  *If you need a refill on your cardiac medications before your next appointment, please call your pharmacy*   Lab Work: None ordered  If you have labs (blood work) drawn today and your  tests are completely normal, you will receive your results only by: Marland Kitchen MyChart Message (if you have MyChart) OR . A paper copy in the mail If you have any lab test that is abnormal or we need to change your treatment, we will call you to review the results.   Testing/Procedures:  1.  Your physician has requested that you have an echocardiogram. Echocardiography is a painless test that uses sound waves to create images of your heart. It provides your doctor with information about the size and shape of your heart and how well your heart's chambers and valves are working. This procedure takes approximately one hour. There are no restrictions for this procedure.  2.  Your physician has requested that you have cardiac CT. Cardiac computed tomography (CT) is a painless test that uses an x-ray machine to take clear, detailed pictures of your heart.    Your cardiac CT will be scheduled at:  [facility] [address] [place], [place] [address] [phone]   Please arrive 15 mins early for check-in and test prep.   Please follow these instructions carefully (unless otherwise directed):   at least 3 days (72 hrs) prior to test.  On the Night Before the Test: . Be sure to Drink plenty of water. . Do not consume any caffeinated/decaffeinated beverages or chocolate 12 hours prior to your test. . Do not take any antihistamines 12 hours prior to your test.   On the Day of the  Test: . Drink plenty of water. Do not drink any water within one hour of the test. . Do not eat any food 4 hours prior to the test.  . 1.  Take metoprolol (Lopressor) two hours prior to test.  2.  HOLD your sildenafil (VIAGRA)  at least 3 days (72 hrs) prior to test.       After the Test: . Drink plenty of water. . After receiving IV contrast, you may experience a mild flushed feeling. This is normal. . On occasion, you may experience a mild rash up to 24 hours after the test. This is not dangerous. If this occurs, you can take Benadryl 25 mg and increase your fluid intake. . If you experience trouble breathing, this can be serious. If it is severe call 911 IMMEDIATELY. If it is mild, please call our office. . If you take any of these medications: Glipizide/Metformin, Avandament, Glucavance, please do not take 48 hours after completing test unless otherwise instructed.   Once we have confirmed authorization from your insurance company, we will call you to set up a date and time for your test. Based on how quickly your insurance processes prior authorizations requests, please allow up to 4 weeks to be contacted for scheduling your Cardiac CT appointment. Be advised that routine Cardiac CT appointments could be scheduled as many as 8 weeks after your provider has ordered it.  For non-scheduling related questions, please contact the cardiac imaging nurse navigator should you have any questions/concerns: Marchia Bond, Cardiac Imaging Nurse Navigator Burley Saver, Interim Cardiac Imaging Nurse [place] [facility] Direct Office Dial: [phone]   For scheduling needs, including cancellations and rescheduling, please call [place], [phone].    Follow-Up: At [facility], you and your health needs are our priority.  As part of our continuing mission to provide you with exceptional heart care, we have created designated Provider Care Teams.  These Care Teams  include your primary Cardiologist (physician) and Advanced  Practice Providers (APPs -  Physician Assistants and Nurse Practitioners) who all work together to provide you with the care you need, when you need it.  We recommend signing up for the patient portal called "MyChart".  Sign up information is provided on this After Visit Summary.  MyChart is used to connect with patients for Virtual Visits (Telemedicine).  Patients are able to view lab/test results, encounter notes, upcoming appointments, etc.  Non-urgent messages can be sent to your provider as well.   To learn more about what you can do with MyChart, go to [email or web address].    Your next appointment:   5 week(s)  The format for your next appointment:   In Person  Provider:   Kate Sable, MD   Other Instructions '     Signed, Kate Sable, MD  07/18/2020 3:13 PM    [place]

## 2020-07-18 NOTE — Patient Instructions (Signed)
Medication Instructions:  Your physician has recommended you make the following change in your medication:   1.  START Amlodipine 10 MG daily.  2.  START Lipitor 40 MG daily.  *If you need a refill on your cardiac medications before your next appointment, please call your pharmacy*   Lab Work: None ordered  If you have labs (blood work) drawn today and your tests are completely normal, you will receive your results only by: Marland Kitchen MyChart Message (if you have MyChart) OR . A paper copy in the mail If you have any lab test that is abnormal or we need to change your treatment, we will call you to review the results.   Testing/Procedures:  1.  Your physician has requested that you have an echocardiogram. Echocardiography is a painless test that uses sound waves to create images of your heart. It provides your doctor with information about the size and shape of your heart and how well your heart's chambers and valves are working. This procedure takes approximately one hour. There are no restrictions for this procedure.  2.  Your physician has requested that you have cardiac CT. Cardiac computed tomography (CT) is a painless test that uses an x-ray machine to take clear, detailed pictures of your heart.    Your cardiac CT will be scheduled at:  [facility] [address] [place], [place] [address] [phone]   Please arrive 15 mins early for check-in and test prep.   Please follow these instructions carefully (unless otherwise directed):   at least 3 days (72 hrs) prior to test.  On the Night Before the Test: . Be sure to Drink plenty of water. . Do not consume any caffeinated/decaffeinated beverages or chocolate 12 hours prior to your test. . Do not take any antihistamines 12 hours prior to your test.   On the Day of the Test: . Drink plenty of water. Do not drink any water within one hour of the test. . Do not eat any food 4  hours prior to the test.  . 1.  Take metoprolol (Lopressor) two hours prior to test.  2.  HOLD your sildenafil (VIAGRA)  at least 3 days (72 hrs) prior to test.       After the Test: . Drink plenty of water. . After receiving IV contrast, you may experience a mild flushed feeling. This is normal. . On occasion, you may experience a mild rash up to 24 hours after the test. This is not dangerous. If this occurs, you can take Benadryl 25 mg and increase your fluid intake. . If you experience trouble breathing, this can be serious. If it is severe call 911 IMMEDIATELY. If it is mild, please call our office. . If you take any of these medications: Glipizide/Metformin, Avandament, Glucavance, please do not take 48 hours after completing test unless otherwise instructed.   Once we have confirmed authorization from your insurance company, we will call you to set up a date and time for your test. Based on how quickly your insurance processes prior authorizations requests, please allow up to 4 weeks to be contacted for scheduling your Cardiac CT appointment. Be advised that routine Cardiac CT appointments could be scheduled as many as 8 weeks after your provider has ordered it.  For non-scheduling related questions, please contact the cardiac imaging nurse navigator should you have any questions/concerns: Marchia Bond, Cardiac Imaging Nurse Navigator Burley Saver, Interim Cardiac Imaging Nurse Navigator [place] [facility] Direct Office Dial: [phone]   For scheduling needs, including cancellations and rescheduling, please call [place], [phone].    Follow-Up: At [facility], you and your health needs are our priority.  As part of our continuing mission to provide you with exceptional heart care, we have created designated Provider Care Teams.  These Care Teams include your primary Cardiologist (physician) and Advanced Practice Providers (APPs -  Physician Assistants and  Nurse Practitioners) who all work together to provide you with the care you need, when you need it.  We recommend signing up for the patient portal called "MyChart".  Sign up information is provided on this After Visit Summary.  MyChart is used to connect with patients for Virtual Visits (Telemedicine).  Patients are able to view lab/test results, encounter notes, upcoming appointments, etc.  Non-urgent messages can be sent to your provider as well.   To learn more about what you can do with MyChart, go to [email or web address].    Your next appointment:   5 week(s)  The format for your next appointment:   In Person  Provider:   Kate Sable, MD   Other Instructions '

## 2020-07-24 ENCOUNTER — Encounter: Payer: Self-pay | Admitting: Nurse Practitioner

## 2020-07-24 ENCOUNTER — Other Ambulatory Visit: Payer: Self-pay

## 2020-07-24 ENCOUNTER — Ambulatory Visit: Payer: BC Managed Care – PPO | Admitting: Nurse Practitioner

## 2020-07-24 VITALS — BP 138/104 | HR 96 | Temp 97.0°F | Wt 213.5 lb

## 2020-07-24 DIAGNOSIS — F17210 Nicotine dependence, cigarettes, uncomplicated: Secondary | ICD-10-CM

## 2020-07-24 DIAGNOSIS — R079 Chest pain, unspecified: Secondary | ICD-10-CM | POA: Diagnosis not present

## 2020-07-24 DIAGNOSIS — E782 Mixed hyperlipidemia: Secondary | ICD-10-CM | POA: Insufficient documentation

## 2020-07-24 DIAGNOSIS — I10 Essential (primary) hypertension: Secondary | ICD-10-CM

## 2020-07-24 NOTE — Assessment & Plan Note (Signed)
Discussed smoking cessation and is not interested at this time

## 2020-07-24 NOTE — Assessment & Plan Note (Addendum)
Blood pressure 138/104 today which has improved however is not at goal. He started taking his amlodipine 10mg  on 07/18/20 which has only been 6 days. He has felt tired in the past with BP medication. He is now taking his BP medication at night and has not experienced any tiredness. Will continue this dose for now and re-check blood pressure at next visit to see if medications need to be adjusted. Follow-up in 3-4 weeks.

## 2020-07-24 NOTE — Assessment & Plan Note (Signed)
Chest cramping occurring less frequently. Saw cardiology on 07/18/20 and has plans for echocardiogram and coronary CTA. Has follow-up with cardiology next month. Discussed calling 911 or going to ER for future episodes of chest pain. Follow-up in 3-4 weeks.

## 2020-07-24 NOTE — Assessment & Plan Note (Signed)
Started lipitor 40mg  daily and aspirin 81mg  daily on 07/18/20. No side effects and tolerating well. Continue current regimen. Will check labs next visit. Follow-up in 3-4 weeks.

## 2020-07-24 NOTE — Patient Instructions (Addendum)
It was great to see you!  Continue taking your amlodipine and lipitor. Your cardiologist will follow-up with you regarding the echocardiogram and coronary CT that was ordered.   Let's follow-up in 3-4 weeks, sooner if you have concerns.  Take care,  Rodman Pickle, NP   High Cholesterol  High cholesterol is a condition in which the blood has high levels of a white, waxy substance similar to fat (cholesterol). The liver makes all the cholesterol that the body needs. The human body needs small amounts of cholesterol to help build cells. A person gets extra or excess cholesterol from the food that he or she eats. The blood carries cholesterol from the liver to the rest of the body. If you have high cholesterol, deposits (plaques) may build up on the walls of your arteries. Arteries are the blood vessels that carry blood away from your heart. These plaques make the arteries narrow and stiff. Cholesterol plaques increase your risk for heart attack and stroke. Work with your health care provider to keep your cholesterol levels in a healthy range. What increases the risk? The following factors may make you more likely to develop this condition:  Eating foods that are high in animal fat (saturated fat) or cholesterol.  Being overweight.  Not getting enough exercise.  A family history of high cholesterol (familial hypercholesterolemia).  Use of tobacco products.  Having diabetes. What are the signs or symptoms? There are no symptoms of this condition. How is this diagnosed? This condition may be diagnosed based on the results of a blood test.  If you are older than 43 years of age, your health care provider may check your cholesterol levels every 4-6 years.  You may be checked more often if you have high cholesterol or other risk factors for heart disease. The blood test for cholesterol measures:  "Bad" cholesterol, or LDL cholesterol. This is the main type of cholesterol that causes  heart disease. The desired level is less than 100 mg/dL.  "Good" cholesterol, or HDL cholesterol. HDL helps protect against heart disease by cleaning the arteries and carrying the LDL to the liver for processing. The desired level for HDL is 60 mg/dL or higher.  Triglycerides. These are fats that your body can store or burn for energy. The desired level is less than 150 mg/dL.  Total cholesterol. This measures the total amount of cholesterol in your blood and includes LDL, HDL, and triglycerides. The desired level is less than 200 mg/dL. How is this treated? This condition may be treated with:  Diet changes. You may be asked to eat foods that have more fiber and less saturated fats or added sugar.  Lifestyle changes. These may include regular exercise, maintaining a healthy weight, and quitting use of tobacco products.  Medicines. These are given when diet and lifestyle changes have not worked. You may be prescribed a statin medicine to help lower your cholesterol levels. Follow these instructions at home: Eating and drinking  Eat a healthy, balanced diet. This diet includes: ? Daily servings of a variety of fresh, frozen, or canned fruits and vegetables. ? Daily servings of whole grain foods that are rich in fiber. ? Foods that are low in saturated fats and trans fats. These include poultry and fish without skin, lean cuts of meat, and low-fat dairy products. ? A variety of fish, especially oily fish that contain omega-3 fatty acids. Aim to eat fish at least 2 times a week.  Avoid foods and drinks that have added  sugar.  Use healthy cooking methods, such as roasting, grilling, broiling, baking, poaching, steaming, and stir-frying. Do not fry your food except for stir-frying.   Lifestyle  Get regular exercise. Aim to exercise for a total of 150 minutes a week. Increase your activity level by doing activities such as gardening, walking, and taking the stairs.  Do not use any products  that contain nicotine or tobacco, such as cigarettes, e-cigarettes, and chewing tobacco. If you need help quitting, ask your health care provider.   General instructions  Take over-the-counter and prescription medicines only as told by your health care provider.  Keep all follow-up visits as told by your health care provider. This is important. Where to find more information  American Heart Association: www.heart.org  National Heart, Lung, and Blood Institute: PopSteam.is Contact a health care provider if:  You have trouble achieving or maintaining a healthy diet or weight.  You are starting an exercise program.  You are unable to stop smoking. Get help right away if:  You have chest pain.  You have trouble breathing.  You have any symptoms of a stroke. "BE FAST" is an easy way to remember the main warning signs of a stroke: ? B - Balance. Signs are dizziness, sudden trouble walking, or loss of balance. ? E - Eyes. Signs are trouble seeing or a sudden change in vision. ? F - Face. Signs are sudden weakness or numbness of the face, or the face or eyelid drooping on one side. ? A - Arms. Signs are weakness or numbness in an arm. This happens suddenly and usually on one side of the body. ? S - Speech. Signs are sudden trouble speaking, slurred speech, or trouble understanding what people say. ? T - Time. Time to call emergency services. Write down what time symptoms started.  You have other signs of a stroke, such as: ? A sudden, severe headache with no known cause. ? Nausea or vomiting. ? Seizure. These symptoms may represent a serious problem that is an emergency. Do not wait to see if the symptoms will go away. Get medical help right away. Call your local emergency services (911 in the [place] Do not drive yourself to the hospital. Summary  Cholesterol plaques increase your risk for heart attack and stroke. Work with your health care provider to keep your cholesterol  levels in a healthy range.  Eat a healthy, balanced diet, get regular exercise, and maintain a healthy weight.  Do not use any products that contain nicotine or tobacco, such as cigarettes, e-cigarettes, and chewing tobacco.  Get help right away if you have any symptoms of a stroke. This information is not intended to replace advice given to you by your health care provider. Make sure you discuss any questions you have with your health care provider. Document Revised: 04/30/2019 Document Reviewed: 04/30/2019 Elsevier Patient Education  2021 [employer].

## 2020-07-24 NOTE — Progress Notes (Signed)
Established Patient Office Visit  Subjective:  Patient ID: Cody Aguilar, male    DOB: 07/28/1975  Age: 45 y.o. MRN: [id number]  CC:  Chief Complaint  Patient presents with  . Hypertension  . Chest Pain    Follow up. Seen cardiology recently     HPI Cody Aguilar presents for follow-up on hypertension, hyperlipidemia, and chest pain.  HYPERTENSION / HYPERLIPIDEMIA  Started amlodipine 6 days ago.  Satisfied with current treatment? yes Duration of hypertension: years BP monitoring frequency: a few times a day BP range:  140s/90s-100s BP medication side effects: no Past BP meds: amlodipine Duration of hyperlipidemia: months Cholesterol medication side effects: no Cholesterol supplements: none Past cholesterol medications: atorvastain (lipitor) Medication compliance: excellent compliance Aspirin: yes Recent stressors: no Recurrent headaches: no Visual changes: no Palpitations: no Dyspnea: no Chest pain: yes, Lower extremity edema: no Dizzy/lightheaded: no  Chest Pain  Chest cramping has been occurring less frequently. It happens intermittently at random times. Not associated with activity. Pain not radiating. Currently chest pain free. Saw cardiology 07/18/20 and has a follow-up again next month. He is not having any dizziness any more with bending over.   Past Medical History:  Diagnosis Date  . High cholesterol     Past Surgical History:  Procedure Laterality Date  . COLONOSCOPY    . hemorrhoid removal      Family History  Problem Relation Age of Onset  . Hypertension Mother   . Diabetes Mother   . Heart disease Mother   . Multiple sclerosis Mother   . Hypertension Father   . Hypertension Brother   . Heart disease Maternal Grandmother   . Heart disease Maternal Grandfather     Social History   Socioeconomic History  . Marital status: Married    Spouse name: Not on file  . Number of children: Not on file  . Years of education: Not on  file  . Highest education level: Not on file  Occupational History  . Not on file  Tobacco Use  . Smoking status: Current Every Day Smoker    Packs/day: 1.00    Types: Cigarettes  . Smokeless tobacco: Never Used  Vaping Use  . Vaping Use: Former  Substance and Sexual Activity  . Alcohol use: No  . Drug use: No  . Sexual activity: Yes  Other Topics Concern  . Not on file  Social History Narrative  . Not on file   Social Determinants of Health   Financial Resource Strain: Not on file  Food Insecurity: Not on file  Transportation Needs: Not on file  Physical Activity: Not on file  Stress: Not on file  Social Connections: Not on file  Intimate Partner Violence: Not on file    Outpatient Medications Prior to Visit  Medication Sig Dispense Refill  . amLODipine (NORVASC) 10 MG tablet Take 1 tablet (10 mg total) by mouth daily. 30 tablet 5  . aspirin EC 81 MG tablet Take 81 mg by mouth daily. Swallow whole.    Marland Kitchen atorvastatin (LIPITOR) 40 MG tablet Take 1 tablet (40 mg total) by mouth daily. 30 tablet 5  . sildenafil (VIAGRA) 100 MG tablet Take 1 tablet (100 mg total) by mouth daily as needed for erectile dysfunction. 30 tablet 3  . metoprolol tartrate (LOPRESSOR) 100 MG tablet Take 1 tablet (100 mg total) by mouth once for 1 dose. Take 2 hours prior to your CT scan. 1 tablet 0   No facility-administered medications prior to  visit.    Allergies  Allergen Reactions  . Oxymetazoline Swelling    Pt. Reports when he has tried to use any OTC nasal spray his face swells    ROS Review of Systems  Constitutional: Negative for appetite change and fatigue.  HENT: Negative.   Respiratory: Negative.   Cardiovascular: Positive for chest pain (intermittent cramping, improving). Negative for palpitations and leg swelling.  Gastrointestinal: Negative.   Endocrine: Positive for polyuria. Negative for polydipsia and polyphagia.  Genitourinary: Positive for frequency.  Musculoskeletal:  Negative.   Neurological: Negative for dizziness, light-headedness, numbness and headaches.  Psychiatric/Behavioral: Negative.       Objective:    Physical Exam Vitals and nursing note reviewed.  Constitutional:      Appearance: Normal appearance.  HENT:     Head: Normocephalic and atraumatic.  Neck:     Vascular: No carotid bruit.  Cardiovascular:     Rate and Rhythm: Normal rate and regular rhythm.     Pulses: Normal pulses.     Heart sounds: Normal heart sounds.  Pulmonary:     Effort: Pulmonary effort is normal.     Breath sounds: Normal breath sounds.  Abdominal:     General: There is no distension.     Palpations: Abdomen is soft.     Tenderness: There is no abdominal tenderness.  Musculoskeletal:     Cervical back: Normal range of motion.     Right lower leg: No edema.     Left lower leg: No edema.  Skin:    General: Skin is warm and dry.  Neurological:     General: No focal deficit present.     Mental Status: He is alert and oriented to person, place, and time.  Psychiatric:        Mood and Affect: Mood normal.        Behavior: Behavior normal.        Thought Content: Thought content normal.        Judgment: Judgment normal.     BP (!) 138/104 (BP Location: Left Arm, Patient Position: Sitting)   Pulse 96   Temp (!) 97 F (36.1 C)   Wt 213 lb 8 oz (96.8 kg)   SpO2 97%   BMI 28.17 kg/m  Wt Readings from Last 3 Encounters:  07/24/20 213 lb 8 oz (96.8 kg)  07/18/20 213 lb (96.6 kg)  07/17/20 213 lb (96.6 kg)     Lab Results  Component Value Date   TSH 1.200 07/17/2020   Lab Results  Component Value Date   WBC 7.3 07/17/2020   HGB 14.6 07/17/2020   HCT 44.2 07/17/2020   MCV 89 07/17/2020   PLT 309 07/17/2020   Lab Results  Component Value Date   NA 141 07/17/2020   K 4.3 07/17/2020   CO2 23 07/17/2020   GLUCOSE 73 07/17/2020   BUN 6 07/17/2020   CREATININE 1.29 (H) 07/17/2020   BILITOT 0.4 07/17/2020   ALKPHOS 96 07/17/2020   AST 23  07/17/2020   ALT 27 07/17/2020   PROT 7.9 07/17/2020   ALBUMIN 4.7 07/17/2020   CALCIUM 9.9 07/17/2020   ANIONGAP 6 10/21/2017   Lab Results  Component Value Date   CHOL 238 (H) 07/17/2020   Lab Results  Component Value Date   HDL 30 (L) 07/17/2020   Lab Results  Component Value Date   LDLCALC 177 (H) 07/17/2020   Lab Results  Component Value Date   TRIG 168 (H) 07/17/2020  Assessment & Plan:   Problem List Items Addressed This Visit      Cardiovascular and Mediastinum   Essential hypertension    Blood pressure 138/104 today which has improved however is not at goal. He started taking his amlodipine 10mg  on 07/18/20 which has only been 6 days. He has felt tired in the past with BP medication. He is now taking his BP medication at night and has not experienced any tiredness. Will continue this dose for now and re-check blood pressure at next visit to see if medications need to be adjusted. Follow-up in 3-4 weeks.       Relevant Medications   aspirin EC 81 MG tablet     Other   Cigarette smoker    Discussed smoking cessation and is not interested at this time      Chest pain - Primary    Chest cramping occurring less frequently. Saw cardiology on 07/18/20 and has plans for echocardiogram and coronary CTA. Has follow-up with cardiology next month. Discussed calling 911 or going to ER for future episodes of chest pain. Follow-up in 3-4 weeks.      Mixed hyperlipidemia    Started lipitor 40mg  daily and aspirin 81mg  daily on 07/18/20. No side effects and tolerating well. Continue current regimen. Will check labs next visit. Follow-up in 3-4 weeks.       Relevant Medications   aspirin EC 81 MG tablet      No orders of the defined types were placed in this encounter.   Follow-up: Return in about 4 weeks (around 08/21/2020) for BP, HLD.    , NP

## 2020-08-05 ENCOUNTER — Telehealth (HOSPITAL_COMMUNITY): Payer: Self-pay | Admitting: *Deleted

## 2020-08-05 ENCOUNTER — Ambulatory Visit (INDEPENDENT_AMBULATORY_CARE_PROVIDER_SITE_OTHER): Payer: BC Managed Care – PPO

## 2020-08-05 ENCOUNTER — Telehealth (HOSPITAL_COMMUNITY): Payer: Self-pay | Admitting: Emergency Medicine

## 2020-08-05 ENCOUNTER — Other Ambulatory Visit: Payer: Self-pay

## 2020-08-05 DIAGNOSIS — R072 Precordial pain: Secondary | ICD-10-CM | POA: Diagnosis not present

## 2020-08-05 NOTE — Telephone Encounter (Signed)
Reaching out to patient to offer assistance regarding upcoming cardiac imaging study; pt verbalizes understanding of appt date/time, parking situation and where to check in, pre-test NPO status and medications ordered, and verified current allergies; name and call back number provided for further questions should they arise Rockwell Alexandria RN Navigator Cardiac Imaging [facility] [phone] office [phone] cell    Pt instructed not to take viagra x 2 days prior to scan Pt to take 100mg  metoprolol tartrate 2 hr prior to scan 

## 2020-08-05 NOTE — Telephone Encounter (Signed)
Attempted to call patient regarding upcoming cardiac CT appointment. °Left message on voicemail with name and callback number ° °Merle Prescott RN Navigator Cardiac Imaging °[facility] °[phone] Office °[phone] Cell ° °

## 2020-08-06 LAB — ECHOCARDIOGRAM COMPLETE
Area-P 1/2: 2.91 cm2
S' Lateral: 3.5 cm

## 2020-08-07 ENCOUNTER — Ambulatory Visit
Admission: RE | Admit: 2020-08-07 | Discharge: 2020-08-07 | Disposition: A | Discharge status: Home / Self Care | Payer: BC Managed Care – PPO | Source: Ambulatory Visit | Attending: Cardiology | Admitting: Cardiology

## 2020-08-07 ENCOUNTER — Other Ambulatory Visit: Payer: Self-pay

## 2020-08-07 DIAGNOSIS — R072 Precordial pain: Secondary | ICD-10-CM | POA: Diagnosis not present

## 2020-08-07 MED ORDER — NITROGLYCERIN 0.4 MG SL SUBL
0.8000 mg | SUBLINGUAL_TABLET | Freq: Once | SUBLINGUAL | Status: AC
  Administered 2020-08-07: 0.8 mg via SUBLINGUAL

## 2020-08-07 MED ORDER — IOHEXOL 350 MG/ML SOLN
75.0000 mL | Freq: Once | INTRAVENOUS | Status: AC | PRN
  Administered 2020-08-07: 75 mL via INTRAVENOUS

## 2020-08-07 NOTE — Progress Notes (Signed)
Patient tolerated procedure well. Ambulate w/o difficulty. Sitting in chair drinking water provided. Encouraged to drink extra water today and reasoning explained. Verbalized understanding. All questions answered. ABC intact. No further needs. Discharge from procedure area w/o issues.  

## 2020-08-15 ENCOUNTER — Telehealth: Payer: Self-pay | Admitting: Cardiology

## 2020-08-15 NOTE — Telephone Encounter (Signed)
Called patient and informed him I left 2 copies of his return to work letter at the front desk for pick up and sent one to him through his MyChart.  Patient was grateful for the callback.

## 2020-08-15 NOTE — Telephone Encounter (Signed)
Patient needs a return to work with no restrictions letter.  Please call when ready for pick up.  Also route through my chart per patient request.

## 2020-08-22 ENCOUNTER — Ambulatory Visit: Payer: BC Managed Care – PPO | Admitting: Nurse Practitioner

## 2020-08-22 ENCOUNTER — Ambulatory Visit: Payer: BC Managed Care – PPO | Admitting: Family Medicine

## 2020-08-29 ENCOUNTER — Ambulatory Visit: Payer: BC Managed Care – PPO | Admitting: Cardiology

## 2020-09-18 ENCOUNTER — Other Ambulatory Visit: Payer: Self-pay

## 2020-09-18 ENCOUNTER — Encounter: Payer: Self-pay | Admitting: Family Medicine

## 2020-09-18 ENCOUNTER — Ambulatory Visit: Payer: BC Managed Care – PPO | Admitting: Family Medicine

## 2020-09-18 VITALS — BP 128/84 | HR 74 | Temp 97.7°F | Wt 211.6 lb

## 2020-09-18 DIAGNOSIS — E782 Mixed hyperlipidemia: Secondary | ICD-10-CM | POA: Diagnosis not present

## 2020-09-18 DIAGNOSIS — I10 Essential (primary) hypertension: Secondary | ICD-10-CM

## 2020-09-18 MED ORDER — SILDENAFIL CITRATE 100 MG PO TABS: 100.0000 mg | ORAL_TABLET | Freq: Every day | ORAL | 3 refills | Status: AC | PRN

## 2020-09-18 NOTE — Progress Notes (Signed)
BP 128/84   Pulse 74   Temp 97.7 F (36.5 C)   Wt 211 lb 9.6 oz (96 kg)   SpO2 99%   BMI 27.92 kg/m    Subjective:    Patient ID: Cody Aguilar, male    DOB: 05-20-1976, 45 y.o.   MRN: [id number]  HPI: Cody Aguilar is a 45 y.o. male  Chief Complaint  Patient presents with  . Hypertension  . Hyperlipidemia  . Erectile Dysfunction    Patient would like refill on sildenafil    HYPERTENSION / HYPERLIPIDEMIA Satisfied with current treatment? yes Duration of hypertension: chronic BP monitoring frequency: not checking BP medication side effects: no Past BP meds: amlodipine Duration of hyperlipidemia: chronic Cholesterol medication side effects: no Cholesterol supplements: none Past cholesterol medications: atorvastatin Medication compliance: excellent compliance Aspirin: yes Recent stressors: no Recurrent headaches: no Visual changes: no Palpitations: no Dyspnea: no Chest pain: no Lower extremity edema: no Dizzy/lightheaded: no  Relevant past medical, surgical, family and social history reviewed and updated as indicated. Interim medical history since our last visit reviewed. Allergies and medications reviewed and updated.  Review of Systems  Constitutional: Negative.   Respiratory: Negative.   Cardiovascular: Negative.   Gastrointestinal: Negative.   Musculoskeletal: Negative.   Neurological: Negative.   Psychiatric/Behavioral: Negative.     Per HPI unless specifically indicated above     Objective:    BP 128/84   Pulse 74   Temp 97.7 F (36.5 C)   Wt 211 lb 9.6 oz (96 kg)   SpO2 99%   BMI 27.92 kg/m   Wt Readings from Last 3 Encounters:  09/18/20 211 lb 9.6 oz (96 kg)  07/24/20 213 lb 8 oz (96.8 kg)  07/18/20 213 lb (96.6 kg)    Physical Exam Vitals and nursing note reviewed.  Constitutional:      General: He is not in acute distress.    Appearance: Normal appearance. He is not ill-appearing, toxic-appearing or diaphoretic.   HENT:     Head: Normocephalic and atraumatic.     Right Ear: External ear normal.     Left Ear: External ear normal.     Nose: Nose normal.     Mouth/Throat:     Mouth: Mucous membranes are moist.     Pharynx: Oropharynx is clear.  Eyes:     General: No scleral icterus.       Right eye: No discharge.        Left eye: No discharge.     Extraocular Movements: Extraocular movements intact.     Conjunctiva/sclera: Conjunctivae normal.     Pupils: Pupils are equal, round, and reactive to light.  Cardiovascular:     Rate and Rhythm: Normal rate and regular rhythm.     Pulses: Normal pulses.     Heart sounds: Normal heart sounds. No murmur heard. No friction rub. No gallop.   Pulmonary:     Effort: Pulmonary effort is normal. No respiratory distress.     Breath sounds: Normal breath sounds. No stridor. No wheezing, rhonchi or rales.  Chest:     Chest wall: No tenderness.  Musculoskeletal:        General: Normal range of motion.     Cervical back: Normal range of motion and neck supple.  Skin:    General: Skin is warm and dry.     Capillary Refill: Capillary refill takes less than 2 seconds.     Coloration: Skin is not jaundiced or pale.  Findings: No bruising, erythema, lesion or rash.  Neurological:     General: No focal deficit present.     Mental Status: He is alert and oriented to person, place, and time. Mental status is at baseline.  Psychiatric:        Mood and Affect: Mood normal.        Behavior: Behavior normal.        Thought Content: Thought content normal.        Judgment: Judgment normal.     Results for orders placed or performed in visit on 08/05/20  ECHOCARDIOGRAM COMPLETE  Result Value Ref Range   S' Lateral 3.50 cm   Area-P 1/2 2.91 cm2      Assessment & Plan:   Problem List Items Addressed This Visit      Cardiovascular and Mediastinum   Essential hypertension - Primary    Under good control on current regimen. Continue current regimen.  Continue to monitor. Call with any concerns. Refills up to date. Labs drawn today.        Relevant Medications   sildenafil (VIAGRA) 100 MG tablet   Other Relevant Orders   Comprehensive metabolic panel     Other   Mixed hyperlipidemia    Under good control on current regimen. Continue current regimen. Continue to monitor. Call with any concerns. Refills up to date. Labs drawn today.       Relevant Medications   sildenafil (VIAGRA) 100 MG tablet   Other Relevant Orders   Comprehensive metabolic panel   Lipid Panel w/o Chol/HDL Ratio       Follow up plan: Return in about 6 months (around 03/20/2021).

## 2020-09-18 NOTE — Assessment & Plan Note (Signed)
Under good control on current regimen. Continue current regimen. Continue to monitor. Call with any concerns. Refills up to date. Labs drawn today.  

## 2020-09-19 LAB — COMPREHENSIVE METABOLIC PANEL
ALT: 35 IU/L (ref 0–44)
AST: 24 IU/L (ref 0–40)
Albumin/Globulin Ratio: 1.5 (ref 1.2–2.2)
Albumin: 4.6 g/dL (ref 4.0–5.0)
Alkaline Phosphatase: 111 IU/L (ref 44–121)
BUN/Creatinine Ratio: 4 — ABNORMAL LOW (ref 9–20)
BUN: 6 mg/dL (ref 6–24)
Bilirubin Total: 0.4 mg/dL (ref 0.0–1.2)
CO2: 22 mmol/L (ref 20–29)
Calcium: 10.1 mg/dL (ref 8.7–10.2)
Chloride: 102 mmol/L (ref 96–106)
Creatinine, Ser: 1.42 mg/dL — ABNORMAL HIGH (ref 0.76–1.27)
Globulin, Total: 3.1 g/dL (ref 1.5–4.5)
Glucose: 91 mg/dL (ref 65–99)
Potassium: 4.1 mmol/L (ref 3.5–5.2)
Sodium: 139 mmol/L (ref 134–144)
Total Protein: 7.7 g/dL (ref 6.0–8.5)
eGFR: 63 mL/min/{1.73_m2} (ref >59)

## 2020-09-19 LAB — LIPID PANEL W/O CHOL/HDL RATIO
Cholesterol, Total: 221 mg/dL — ABNORMAL HIGH (ref 100–199)
HDL: 28 mg/dL — ABNORMAL LOW (ref >39)
LDL Chol Calc (NIH): 157 mg/dL — ABNORMAL HIGH (ref 0–99)
Triglycerides: 195 mg/dL — ABNORMAL HIGH (ref 0–149)
VLDL Cholesterol Cal: 36 mg/dL (ref 5–40)

## 2020-09-23 ENCOUNTER — Other Ambulatory Visit: Payer: Self-pay | Admitting: Family Medicine

## 2020-09-23 DIAGNOSIS — N289 Disorder of kidney and ureter, unspecified: Secondary | ICD-10-CM

## 2021-07-01 ENCOUNTER — Ambulatory Visit: Payer: Self-pay

## 2021-07-01 NOTE — Telephone Encounter (Signed)
°  Chief Complaint: SOB, congestion Symptoms: SOB, congestion, cough Frequency: 1 month Pertinent Negatives: Patient denies fever Disposition: [] ED /[] Urgent Care (no appt availability in office) / [x] Appointment(In office/virtual)/ []  [facility]/ [] Home Care/ [] Refused Recommended Disposition /[] [facility]/ []  Follow-up with PCP Additional Notes: Pt called, states he has had congestion and SOB ongoing for 1 month now. 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 Pt was advised d/t symptoms to go to ED but pt refused to go to ED. Pt didn't sound in distress at time of call as well.     Reason for Disposition  [1] MILD difficulty breathing (e.g., minimal/no SOB at rest, SOB with walking, pulse <100) AND [2] NEW-onset or WORSE than normal  Answer Assessment - Initial Assessment Questions 1. RESPIRATORY STATUS: "Describe your breathing?" (e.g., wheezing, shortness of breath, unable to speak, severe coughing)  SOB  2. ONSET: "When did this breathing problem begin?"  A month  3. PATTERN "Does the difficult breathing come and go, or has it been constant since it started?"  constant 4. SEVERITY: "How bad is your breathing?" (e.g., mild, moderate, severe)    - MILD: No SOB at rest, mild SOB with walking, speaks normally in sentences, can lie down, no retractions, pulse < 100.    - MODERATE: SOB at rest, SOB with minimal exertion and prefers to sit, cannot lie down flat, speaks in phrases, mild retractions, audible wheezing, pulse 100-120.    - SEVERE: Very SOB at rest, speaks in single words, struggling to breathe, sitting hunched forward, retractions, pulse > 120  moderate 5. LUNG HISTORY: "Do you have any history of lung disease?"  (e.g., pulmonary embolus, asthma, emphysema)  bronchitis 6. OTHER SYMPTOMS: "Do you have any other symptoms? (e.g.,  dizziness, runny nose, cough, chest pain, fever) Cough, nasal congestion, dizziness 2 weeks ago  Protocols used: Breathing Difficulty-A-AH

## 2021-07-02 ENCOUNTER — Encounter: Payer: Self-pay | Admitting: Nurse Practitioner

## 2021-07-02 ENCOUNTER — Ambulatory Visit
Admission: RE | Admit: 2021-07-02 | Discharge: 2021-07-02 | Disposition: A | Discharge status: Home / Self Care | Payer: BC Managed Care – PPO | Source: Home / Self Care | Attending: Nurse Practitioner | Admitting: Nurse Practitioner

## 2021-07-02 ENCOUNTER — Ambulatory Visit
Admission: RE | Admit: 2021-07-02 | Discharge: 2021-07-02 | Disposition: A | Discharge status: Home / Self Care | Payer: BC Managed Care – PPO | Source: Ambulatory Visit | Attending: Nurse Practitioner | Admitting: Nurse Practitioner

## 2021-07-02 ENCOUNTER — Other Ambulatory Visit: Payer: Self-pay

## 2021-07-02 ENCOUNTER — Ambulatory Visit: Payer: BC Managed Care – PPO | Admitting: Nurse Practitioner

## 2021-07-02 VITALS — BP 160/101 | HR 62 | Temp 98.2°F | Ht 73.0 in | Wt 202.4 lb

## 2021-07-02 DIAGNOSIS — F17210 Nicotine dependence, cigarettes, uncomplicated: Secondary | ICD-10-CM

## 2021-07-02 DIAGNOSIS — R052 Subacute cough: Secondary | ICD-10-CM | POA: Diagnosis not present

## 2021-07-02 DIAGNOSIS — I10 Essential (primary) hypertension: Secondary | ICD-10-CM | POA: Diagnosis not present

## 2021-07-02 DIAGNOSIS — R0602 Shortness of breath: Secondary | ICD-10-CM | POA: Diagnosis not present

## 2021-07-02 DIAGNOSIS — R059 Cough, unspecified: Secondary | ICD-10-CM | POA: Diagnosis not present

## 2021-07-02 MED ORDER — ALBUTEROL SULFATE HFA 108 (90 BASE) MCG/ACT IN AERS: 2.0000 | INHALATION_SPRAY | Freq: Four times a day (QID) | RESPIRATORY_TRACT | 0 refills | Status: AC | PRN

## 2021-07-02 MED ORDER — HYDROCOD POLI-CHLORPHE POLI ER 10-8 MG/5ML PO SUER: 5.0000 mL | Freq: Two times a day (BID) | ORAL | 0 refills | Status: DC | PRN

## 2021-07-02 MED ORDER — AZITHROMYCIN 250 MG PO TABS: ORAL_TABLET | ORAL | 0 refills | Status: AC

## 2021-07-02 MED ORDER — PREDNISONE 20 MG PO TABS: 40.0000 mg | ORAL_TABLET | Freq: Every day | ORAL | 0 refills | Status: AC

## 2021-07-02 MED ORDER — HYDROCHLOROTHIAZIDE 25 MG PO TABS: 25.0000 mg | ORAL_TABLET | Freq: Every day | ORAL | 4 refills | Status: AC

## 2021-07-02 MED ORDER — AMLODIPINE BESYLATE 10 MG PO TABS: 10.0000 mg | ORAL_TABLET | Freq: Every day | ORAL | 4 refills | Status: AC

## 2021-07-02 NOTE — Assessment & Plan Note (Signed)
Ongoing for one month post URI, have high suspicion he had Covid initially although he never tested.  Suspect some ongoing reactive airway related to this.  Will obtain CXR to further assess.  Start Zpack + Prednisone 40 MG daily for 5 days & Tussionex to use as needed at night.  Sent in Albuterol inhaler and instructed him on how and when to use this. Check labs today: CBC, CMP, TSH, Quantiferon, and Mono due to night sweats and ongoing fatigue with cough. Recommend he get plenty of rest and fluids + take Claritin 10 MG daily to help with nasal drainage.  Return in 4 weeks.

## 2021-07-02 NOTE — Progress Notes (Signed)
BP (!) 160/101 (BP Location: Left Arm, Cuff Size: Normal)    Pulse 62    Temp 98.2 F (36.8 C) (Oral)    Ht 6' 1" (1.854 m)    Wt 202 lb 6.4 oz (91.8 kg)    SpO2 98%    BMI 26.70 kg/m    Subjective:    Patient ID: Cody Aguilar, male    DOB: 06/13/1976, 46 y.o.   MRN: [id number]  HPI: Cody Aguilar is a 46 y.o. male  Chief Complaint  Patient presents with   Cough    Patient states he has this cough for going on a month and states he losing his voice here and there. Patient states he has diarrhea to the point where it is straight watery stools. Patient states he coughs more in the mornings and at nights. Patient states his mucus that his produces has been green and clear. Patient states he now has discoloration again with his cough. Patient state his symptoms got worse when it went to wash his truck in the cold weather.    Diarrhea   COUGH Started with symptoms a week before Christmas -- was sick with URI at time.  Did not get tested for Covid or flu at the time.  Thinks he had fever at one point.  Having ongoing cough and nasal drainage and mucus + diarrhea due to ongoing mucus.  Feeling very tired at this this point.  He is a smoker. Duration: months Circumstances of initial development of cough: URI Cough severity: moderate -- normally at night or first thing in the morning Cough description: non-productive Aggravating factors:  worse at night and worse in the AM Alleviating factors:  Tessalon, Delsym, Robitussin Status:  fluctuating Treatments attempted: Tessalon, Delsym, Robitussin Wheezing:  sometimes at night Shortness of breath: a little bit on occasion Chest pain: no Chest tightness:no Nasal congestion: yes Runny nose: yes Postnasal drip: yes Frequent throat clearing or swallowing: yes Hemoptysis: no Fevers: no Night sweats:  on occasion Weight loss: no Heartburn: no Recent foreign travel: no Tuberculosis contacts: no   HYPERTENSION Currently taking  Amlodipine at night.  Has not been seen in office since 09/18/20. Hypertension status: uncontrolled  Satisfied with current treatment? yes Duration of hypertension: chronic BP monitoring frequency:  daily BP range: 150/80 sometimes 160 SBP BP medication side effects:  no Medication compliance: good compliance Previous BP meds: Losartan-- caused hypotension and HCTZ Aspirin: no Recurrent headaches: no Visual changes: no Palpitations: no Dyspnea: no Chest pain: no Lower extremity edema: no Dizzy/lightheaded: has had some these   Relevant past medical, surgical, family and social history reviewed and updated as indicated. Interim medical history since our last visit reviewed. Allergies and medications reviewed and updated.  Review of Systems  Constitutional:  Positive for appetite change and fatigue. Negative for activity change, chills and fever.  HENT:  Positive for postnasal drip and rhinorrhea. Negative for congestion, sinus pressure, sinus pain, sneezing, sore throat and voice change.   Respiratory:  Positive for cough, shortness of breath and wheezing. Negative for chest tightness.   Cardiovascular:  Negative for chest pain, palpitations and leg swelling.  Gastrointestinal:  Positive for diarrhea and nausea. Negative for abdominal distention, abdominal pain and vomiting.  Neurological:  Positive for dizziness and syncope. Negative for tremors, weakness, light-headedness and headaches.  Psychiatric/Behavioral: Negative.     Per HPI unless specifically indicated above     Objective:    BP (!) 160/101 (BP Location: Left  Arm, Cuff Size: Normal)    Pulse 62    Temp 98.2 F (36.8 C) (Oral)    Ht 6' 1" (1.854 m)    Wt 202 lb 6.4 oz (91.8 kg)    SpO2 98%    BMI 26.70 kg/m   Wt Readings from Last 3 Encounters:  07/02/21 202 lb 6.4 oz (91.8 kg)  09/18/20 211 lb 9.6 oz (96 kg)  07/24/20 213 lb 8 oz (96.8 kg)    Physical Exam Vitals and nursing note reviewed.  Constitutional:       General: He is awake. He is not in acute distress.    Appearance: He is well-developed and well-groomed. He is not ill-appearing or toxic-appearing.  HENT:     Head: Normocephalic and atraumatic.     Right Ear: Hearing, tympanic membrane, ear canal and external ear normal. No drainage.     Left Ear: Hearing, tympanic membrane, ear canal and external ear normal. No drainage.     Nose: Rhinorrhea present. Rhinorrhea is clear.     Right Sinus: No maxillary sinus tenderness or frontal sinus tenderness.     Left Sinus: No maxillary sinus tenderness or frontal sinus tenderness.     Mouth/Throat:     Mouth: Mucous membranes are moist.     Pharynx: Uvula midline. Posterior oropharyngeal erythema (mild with cobblestone appearance) present.  Eyes:     General: Lids are normal.        Right eye: No discharge.        Left eye: No discharge.     Conjunctiva/sclera: Conjunctivae normal.     Pupils: Pupils are equal, round, and reactive to light.  Neck:     Thyroid: No thyromegaly.     Vascular: No carotid bruit.  Cardiovascular:     Rate and Rhythm: Normal rate and regular rhythm.     Heart sounds: Normal heart sounds, S1 normal and S2 normal. No murmur heard.   No gallop.  Pulmonary:     Effort: Pulmonary effort is normal. No accessory muscle usage or respiratory distress.     Breath sounds: Normal breath sounds.     Comments: Intermittent dry cough noted. Abdominal:     General: Bowel sounds are normal.     Palpations: Abdomen is soft.  Musculoskeletal:        General: Normal range of motion.     Cervical back: Normal range of motion and neck supple.     Right lower leg: No edema.     Left lower leg: No edema.  Lymphadenopathy:     Head:     Right side of head: No submental, submandibular, tonsillar, preauricular or posterior auricular adenopathy.     Left side of head: No submental, submandibular, tonsillar, preauricular or posterior auricular adenopathy.     Cervical: No cervical  adenopathy.  Skin:    General: Skin is warm and dry.     Capillary Refill: Capillary refill takes less than 2 seconds.     Findings: No rash.  Neurological:     Mental Status: He is alert and oriented to person, place, and time.     Deep Tendon Reflexes: Reflexes are normal and symmetric.  Psychiatric:        Attention and Perception: Attention normal.        Mood and Affect: Mood normal.        Speech: Speech normal.        Behavior: Behavior normal. Behavior is cooperative.  Thought Content: Thought content normal.    Results for orders placed or performed in visit on 09/18/20  Comprehensive metabolic panel  Result Value Ref Range   Glucose 91 65 - 99 mg/dL   BUN 6 6 - 24 mg/dL   Creatinine, Ser 1.42 (H) 0.76 - 1.27 mg/dL   eGFR 63 >59 mL/min/1.73   BUN/Creatinine Ratio 4 (L) 9 - 20   Sodium 139 134 - 144 mmol/L   Potassium 4.1 3.5 - 5.2 mmol/L   Chloride 102 96 - 106 mmol/L   CO2 22 20 - 29 mmol/L   Calcium 10.1 8.7 - 10.2 mg/dL   Total Protein 7.7 6.0 - 8.5 g/dL   Albumin 4.6 4.0 - 5.0 g/dL   Globulin, Total 3.1 1.5 - 4.5 g/dL   Albumin/Globulin Ratio 1.5 1.2 - 2.2   Bilirubin Total 0.4 0.0 - 1.2 mg/dL   Alkaline Phosphatase 111 44 - 121 IU/L   AST 24 0 - 40 IU/L   ALT 35 0 - 44 IU/L  Lipid Panel w/o Chol/HDL Ratio  Result Value Ref Range   Cholesterol, Total 221 (H) 100 - 199 mg/dL   Triglycerides 195 (H) 0 - 149 mg/dL   HDL 28 (L) >39 mg/dL   VLDL Cholesterol Cal 36 5 - 40 mg/dL   LDL Chol Calc (NIH) 157 (H) 0 - 99 mg/dL      Assessment & Plan:   Problem List Items Addressed This Visit       Cardiovascular and Mediastinum   Essential hypertension    Chronic, ongoing.  At this time BP is elevated above goal, even on recheck.  Reviewed last labs with patient from April 2022 where creatinine was mildly elevated, but eGFR normal -- however discussed his high risk for CKD with uncontrolled HTN + other risk factors involved.  Will send in refills on  Amlodipine and start HCTZ 25 MG daily -- recommend he take both consistently.  Last K+ 4.1.  Will avoid ACE or ARB at this time due to subacute cough, but this may be beneficial in future.  Recommend he monitor BP at least a few mornings a week at home and document.  DASH diet at home.  Labs today: CBC, CMP, TSH.  Return in 4 weeks.       Relevant Medications   amLODipine (NORVASC) 10 MG tablet   hydrochlorothiazide (HYDRODIURIL) 25 MG tablet     Other   Nicotine dependence, cigarettes, uncomplicated    I have recommended complete cessation of tobacco use. I have discussed various options available for assistance with tobacco cessation including over the counter methods (Nicotine gum, patch and lozenges). We also discussed prescription options (Chantix, Nicotine Inhaler / Nasal Spray). The patient is not interested in pursuing any prescription tobacco cessation options at this time.       Subacute cough - Primary    Ongoing for one month post URI, have high suspicion he had Covid initially although he never tested.  Suspect some ongoing reactive airway related to this.  Will obtain CXR to further assess.  Start Zpack + Prednisone 40 MG daily for 5 days & Tussionex to use as needed at night.  Sent in Albuterol inhaler and instructed him on how and when to use this. Check labs today: CBC, CMP, TSH, Quantiferon, and Mono due to night sweats and ongoing fatigue with cough. Recommend he get plenty of rest and fluids + take Claritin 10 MG daily to help with nasal drainage.  Return  in 4 weeks.      Relevant Orders   Mononucleosis screen   QuantiFERON-TB Gold Plus   CBC with Differential/Platelet   Comprehensive metabolic panel   TSH   DG Chest 2 View     Follow up plan: Return in about 4 weeks (around 07/30/2021) for HTN and COUGH.

## 2021-07-02 NOTE — Progress Notes (Signed)
Contacted via MyChart   Good evening Cody Aguilar, your imaging has returned and shows no acute findings.  No pneumonia.  There is small area on right back 6th rib that is benign and is a thickening of the bone, often this is a thick piece of bone that can grow within another area of bone and it is stable on this exam and similar to imaging in 2013.  Overall good report.  Any questions? Keep being awesome!!  Thank you for allowing me to participate in your care.  I appreciate you. Kindest regards, Kamil Hanigan

## 2021-07-02 NOTE — Assessment & Plan Note (Signed)
Chronic, ongoing.  At this time BP is elevated above goal, even on recheck.  Reviewed last labs with patient from April 2022 where creatinine was mildly elevated, but eGFR normal -- however discussed his high risk for CKD with uncontrolled HTN + other risk factors involved.  Will send in refills on Amlodipine and start HCTZ 25 MG daily -- recommend he take both consistently.  Last K+ 4.1.  Will avoid ACE or ARB at this time due to subacute cough, but this may be beneficial in future.  Recommend he monitor BP at least a few mornings a week at home and document.  DASH diet at home.  Labs today: CBC, CMP, TSH.  Return in 4 weeks.

## 2021-07-02 NOTE — Patient Instructions (Signed)

## 2021-07-02 NOTE — Assessment & Plan Note (Signed)
I have recommended complete cessation of tobacco use. I have discussed various options available for assistance with tobacco cessation including over the counter methods (Nicotine gum, patch and lozenges). We also discussed prescription options (Chantix, Nicotine Inhaler / Nasal Spray). The patient is not interested in pursuing any prescription tobacco cessation options at this time.  

## 2021-07-03 NOTE — Progress Notes (Signed)
Contacted via [place] morning Cody Aguilar, still waiting on a couple labs, but I will review current labs with you: - Kidney function, creatinine and eGFR, is normal at this time.  However, please ensure you take your blood pressure medicine daily.  In long run goal is to prevent stroke or heart attack, which you are at high risk for.  Plus prevent kidney disease.  We need that blood pressure <130/80. - CBC shows no infection or anemia. - Thyroid lab is normal.  Any questions on these? Keep being amazing!!  Thank you for allowing me to participate in your care.  I appreciate you. Kindest regards, Virgil Slinger

## 2021-07-05 LAB — COMPREHENSIVE METABOLIC PANEL
ALT: 22 IU/L (ref 0–44)
AST: 18 IU/L (ref 0–40)
Albumin/Globulin Ratio: 1.5 (ref 1.2–2.2)
Albumin: 4.5 g/dL (ref 4.0–5.0)
Alkaline Phosphatase: 93 IU/L (ref 44–121)
BUN/Creatinine Ratio: 5 — ABNORMAL LOW (ref 9–20)
BUN: 6 mg/dL (ref 6–24)
Bilirubin Total: 0.3 mg/dL (ref 0.0–1.2)
CO2: 23 mmol/L (ref 20–29)
Calcium: 9.6 mg/dL (ref 8.7–10.2)
Chloride: 103 mmol/L (ref 96–106)
Creatinine, Ser: 1.23 mg/dL (ref 0.76–1.27)
Globulin, Total: 3.1 g/dL (ref 1.5–4.5)
Glucose: 87 mg/dL (ref 70–99)
Potassium: 4.2 mmol/L (ref 3.5–5.2)
Sodium: 141 mmol/L (ref 134–144)
Total Protein: 7.6 g/dL (ref 6.0–8.5)
eGFR: 75 mL/min/{1.73_m2} (ref >59)

## 2021-07-05 LAB — CBC WITH DIFFERENTIAL/PLATELET
Basophils Absolute: 0 10*3/uL (ref 0.0–0.2)
Basos: 1 %
EOS (ABSOLUTE): 0.1 10*3/uL (ref 0.0–0.4)
Eos: 3 %
Hematocrit: 40.2 % (ref 37.5–51.0)
Hemoglobin: 14.1 g/dL (ref 13.0–17.7)
Immature Grans (Abs): 0 10*3/uL (ref 0.0–0.1)
Immature Granulocytes: 0 %
Lymphocytes Absolute: 2.7 10*3/uL (ref 0.7–3.1)
Lymphs: 57 %
MCH: 30.6 pg (ref 26.6–33.0)
MCHC: 35.1 g/dL (ref 31.5–35.7)
MCV: 87 fL (ref 79–97)
Monocytes Absolute: 0.5 10*3/uL (ref 0.1–0.9)
Monocytes: 9 %
Neutrophils Absolute: 1.5 10*3/uL (ref 1.4–7.0)
Neutrophils: 30 %
Platelets: 288 10*3/uL (ref 150–450)
RBC: 4.61 x10E6/uL (ref 4.14–5.80)
RDW: 14.4 % (ref 11.6–15.4)
WBC: 4.8 10*3/uL (ref 3.4–10.8)

## 2021-07-05 LAB — QUANTIFERON-TB GOLD PLUS
QuantiFERON Mitogen Value: >10 IU/mL
QuantiFERON Nil Value: 0.07 IU/mL
QuantiFERON TB1 Ag Value: 0.09 IU/mL
QuantiFERON TB2 Ag Value: 0.07 IU/mL
QuantiFERON-TB Gold Plus: NEGATIVE

## 2021-07-05 LAB — MONONUCLEOSIS SCREEN: Mono Screen: NEGATIVE

## 2021-07-05 LAB — TSH: TSH: 1.37 u[IU]/mL (ref 0.450–4.500)

## 2021-07-30 ENCOUNTER — Ambulatory Visit: Payer: BC Managed Care – PPO | Admitting: Nurse Practitioner

## 2021-09-20 ENCOUNTER — Other Ambulatory Visit: Payer: Self-pay | Admitting: Family Medicine

## 2021-09-21 NOTE — Telephone Encounter (Signed)
Patient last seen 07/02/21 and was due to follow up 1 month after appointment. Please call to scheduled follow up and then route to provider for refill.  ?

## 2021-09-21 NOTE — Telephone Encounter (Signed)
Pt states he is out of town and will call back to schedule once he gets back ?

## 2021-09-21 NOTE — Telephone Encounter (Signed)
Requested medications are due for refill today.  yes ? ?Requested medications are on the active medications list.  yes ? ?Last refill. 09/18/2020 #30 3 refills ? ?Future visit scheduled.   no ? ?Notes to clinic.  Failed protocol d/t HTN. ? ? ? ?Requested Prescriptions  ?Pending Prescriptions Disp Refills  ? sildenafil (VIAGRA) 100 MG tablet [Pharmacy Med Name: SILDENAFIL 100 MG TABLET] 30 tablet 2  ?  Sig: TAKE 1 TABLET BY MOUTH EVERY DAY AS NEEDED FOR ERECTILE DYSFUNCTION  ?  ? Urology: Erectile Dysfunction Agents Failed - 09/20/2021 11:00 AM  ?  ?  Failed - Last BP in normal range  ?  BP Readings from Last 1 Encounters:  ?07/02/21 (!) 160/101  ?  ?  ?  ?  Passed - AST in normal range and within 360 days  ?  AST  ?Date Value Ref Range Status  ?07/02/2021 18 0 - 40 IU/L Final  ? ?SGOT(AST)  ?Date Value Ref Range Status  ?11/12/2011 22 15 - 37 Unit/L Final  ?  ?  ?  ?  Passed - ALT in normal range and within 360 days  ?  ALT  ?Date Value Ref Range Status  ?07/02/2021 22 0 - 44 IU/L Final  ? ?SGPT (ALT)  ?Date Value Ref Range Status  ?11/12/2011 20 U/L Final  ?  Comment:  ?  12-78 ?NOTE: NEW REFERENCE RANGE ?05/07/2011 ?  ?  ?  ?  ?  Passed - Valid encounter within last 12 months  ?  Recent Outpatient Visits   ? ?      ? 2 months ago Subacute cough  ? [facility] [place], Corrie Dandy T, NP  ? 1 year ago Essential hypertension  ? [facility] [place], Megan P, DO  ? 1 year ago Essential hypertension  ? [facility] McElwee, Lauren A, NP  ? 1 year ago Chest pain, unspecified type  ? [facility] Mc[place]we[place], Lauren A, NP  ? 2 years ago Essential hypertension  ? [facility] Particia Nearing, [place]  ? ?  ?  ? ?  ?  ?  ?  ?

## 2021-11-26 ENCOUNTER — Ambulatory Visit: Payer: BC Managed Care – PPO | Admitting: Physician Assistant

## 2021-11-26 VITALS — BP 146/96 | HR 72 | Temp 98.1°F | Ht 72.99 in | Wt 198.8 lb

## 2021-11-26 DIAGNOSIS — R052 Subacute cough: Secondary | ICD-10-CM | POA: Diagnosis not present

## 2021-11-26 DIAGNOSIS — J209 Acute bronchitis, unspecified: Secondary | ICD-10-CM

## 2021-11-26 MED ORDER — LORATADINE 10 MG PO TABS: 10.0000 mg | ORAL_TABLET | Freq: Every day | ORAL | 0 refills | Status: DC

## 2021-11-26 MED ORDER — PREDNISONE 20 MG PO TABS: ORAL_TABLET | ORAL | 0 refills | Status: AC

## 2021-11-26 NOTE — Patient Instructions (Signed)
To assist with your cough I am sending in a script for a Prednisone taper Follow the directions included on the script  Take in the morning to help prevent sleeping trouble I also recommend continuing with Dayquild/ Nyquil, Alkaseltzer, etc per your preference to assist with further symptom management I have sent in a script for Claritin as well to help with any allergy type symptoms you may be having. This should help a little with the ear pain.

## 2021-11-26 NOTE — Progress Notes (Signed)
Acute Office Visit   Patient: Cody Aguilar   DOB: Dec 13, 1975   46 y.o. Male  MRN: [id number] Visit Date: 11/26/2021  Today's healthcare provider: Oswaldo Conroy Deshea Pooley, PA-C  Introduced myself to the patient as a Secondary school teacher and provided education on APPs in clinical practice.    Chief Complaint  Patient presents with   Cough    Keeping him up at night.  Patient states that his chest feels like it is on fire.  Started on Monday    Sore Throat    Started today   Subjective    HPI HPI     Cough    Additional comments: Keeping him up at night.  Patient states that his chest feels like it is on fire.  Started on Monday         Sore Throat    Additional comments: Started today      Last edited by Sherolyn Buba, CMA on 11/26/2021 10:35 AM.       [name] with cough on Monday Reports sore throat started today.  Reports slight pain in right ear Denies sick contacts Denies annual repetition Reports nonproductive cough that is keeping him awake Interventions: he has tried tessalon pearls left over from previous apt- reports some reduction in symptoms with this He has tried using his Inhaler from Jan but states that it seems to make coughing worse   Medications: Outpatient Medications Prior to Visit  Medication Sig   amLODipine (NORVASC) 10 MG tablet Take 1 tablet (10 mg total) by mouth daily.   aspirin EC 81 MG tablet Take 81 mg by mouth daily. Swallow whole.   hydrochlorothiazide (HYDRODIURIL) 25 MG tablet Take 1 tablet (25 mg total) by mouth daily.   albuterol (VENTOLIN HFA) 108 (90 Base) MCG/ACT inhaler Inhale 2 puffs into the lungs every 6 (six) hours as needed for wheezing or shortness of breath.   atorvastatin (LIPITOR) 40 MG tablet Take 1 tablet (40 mg total) by mouth daily.   sildenafil (VIAGRA) 100 MG tablet Take 1 tablet (100 mg total) by mouth daily as needed for erectile dysfunction. (Patient not taking: Reported on 11/26/2021)   [DISCONTINUED]  chlorpheniramine-HYDROcodone (TUSSIONEX PENNKINETIC ER) 10-8 MG/5ML Take 5 mLs by mouth every 12 (twelve) hours as needed for cough.   No facility-administered medications prior to visit.    Review of Systems  Constitutional:  Negative for chills, diaphoresis and fever.  HENT:  Positive for ear pain, sneezing and sore throat. Negative for congestion, rhinorrhea, sinus pressure, sinus pain and trouble swallowing.   Eyes:  Negative for discharge, redness and itching.  Respiratory:  Positive for cough. Negative for shortness of breath and wheezing.   Gastrointestinal:  Negative for diarrhea, nausea and vomiting.  Musculoskeletal:  Positive for myalgias. Negative for arthralgias.  Neurological:  Negative for dizziness and headaches.       Objective    BP (!) 146/96   Pulse 72   Temp 98.1 F (36.7 C) (Oral)   Ht 6' 0.99" (1.854 m)   Wt 198 lb 12.8 oz (90.2 kg)   SpO2 97%   BMI 26.23 kg/m    Physical Exam Vitals reviewed.  Constitutional:      Appearance: He is well-developed.  HENT:     Head: Normocephalic and atraumatic.     Right Ear: Hearing, ear canal and external ear normal. A middle ear effusion is present. Tympanic membrane is not injected, scarred, perforated, erythematous, retracted  or bulging.     Left Ear: Hearing, ear canal and external ear normal.  No middle ear effusion. Tympanic membrane is not injected, scarred, perforated, erythematous, retracted or bulging.     Mouth/Throat:     Lips: Pink.     Pharynx: Uvula midline. No pharyngeal swelling, oropharyngeal exudate, posterior oropharyngeal erythema or uvula swelling.     Tonsils: No tonsillar exudate or tonsillar abscesses.  Cardiovascular:     Rate and Rhythm: Normal rate and regular rhythm.     Pulses: Normal pulses.          Radial pulses are 2+ on the right side and 2+ on the left side.     Heart sounds: Normal heart sounds.  Pulmonary:     Effort: Pulmonary effort is normal. No respiratory distress.      Breath sounds: Normal air entry. No stridor or decreased air movement. Rhonchi present. No decreased breath sounds, wheezing or rales.  Lymphadenopathy:     Head:     Right side of head: No submental or submandibular adenopathy.     Left side of head: No submental or submandibular adenopathy.     Upper Body:     Right upper body: No supraclavicular adenopathy.     Left upper body: No supraclavicular adenopathy.  Neurological:     Mental Status: He is alert.  Psychiatric:        Attention and Perception: Attention normal.        Mood and Affect: Mood normal.        Speech: Speech normal.        Behavior: Behavior normal. Behavior is cooperative.       No results found for any visits on 11/26/21.  Assessment & Plan     Problem List Items Addressed This Visit       Other   Subacute cough  Acute, recurrent concern Reports issues with nonproductive cough that is keeping him awake at night Recommend OTC cough medications or OCT multisymptom medication per preference Offered Tessalon pearls but pt declined stating "they don't work" Suspect cough is related to bronchospasm at this time  See below for management plan Follow up as needed     Other Visit Diagnoses     Bronchitis with bronchospasm    -  Primary Acute, new problem Suspect this is cause of coughing  Recommend 7 day prednisone taper to assist with symptoms Recommend OTC multisymptom to assist with further symptoms Patient endorses sneezing and mild ear pain, this with cough may also represent allergy symptoms Recommend trying Claritin to further assist Follow up as needed for persistent or progressing symptoms.     Relevant Medications   predniSONE (DELTASONE) 20 MG tablet        No follow-ups on file.   I, Donnis Phaneuf E Random Dobrowski, PA-C, have reviewed all documentation for this visit. The documentation on 11/26/21 for the exam, diagnosis, procedures, and orders are all accurate and complete.   Jacquelin Hawking, MHS,  PA-C [facility]    No follow-ups on file.

## 2021-12-23 ENCOUNTER — Other Ambulatory Visit: Payer: Self-pay | Admitting: Physician Assistant

## 2021-12-23 NOTE — Telephone Encounter (Signed)
Requested Prescriptions  Pending Prescriptions Disp Refills  . loratadine (CLARITIN) 10 MG tablet [Pharmacy Med Name: LORATADINE 10 MG TABLET] 30 tablet 2    Sig: TAKE 1 TABLET BY MOUTH EVERY DAY     Ear, Nose, and Throat:  Antihistamines 2 Passed - 12/23/2021  2:38 AM      Passed - Cr in normal range and within 360 days    Creatinine  Date Value Ref Range Status  11/12/2011 1.28 0.60 - 1.30 mg/dL Final   Creatinine, Ser  Date Value Ref Range Status  07/02/2021 1.23 0.76 - 1.27 mg/dL Final         Passed - Valid encounter within last 12 months    Recent Outpatient Visits          3 weeks ago Bronchitis with bronchospasm   [facility] [place], Erin E, PA-C   5 months ago Subacute cough   [facility] [place], [place] T, NP   1 year ago Essential hypertension   [facility] [place], Megan P, DO   1 year ago Essential hypertension   [facility] McElwee, Lauren A, NP   1 year ago Chest pain, unspecified type   [facility], Jake Church, NP

## 2022-01-28 DIAGNOSIS — I10 Essential (primary) hypertension: Secondary | ICD-10-CM | POA: Diagnosis not present

## 2022-01-28 DIAGNOSIS — M79642 Pain in left hand: Secondary | ICD-10-CM | POA: Diagnosis not present

## 2022-01-28 DIAGNOSIS — M79641 Pain in right hand: Secondary | ICD-10-CM | POA: Diagnosis not present

## 2022-07-12 ENCOUNTER — Ambulatory Visit
Admission: EM | Admit: 2022-07-12 | Discharge: 2022-07-12 | Disposition: A | Discharge status: Home / Self Care | Payer: BC Managed Care – PPO | Attending: Physician Assistant | Admitting: Physician Assistant

## 2022-07-12 DIAGNOSIS — Z1152 Encounter for screening for COVID-19: Secondary | ICD-10-CM | POA: Diagnosis not present

## 2022-07-12 DIAGNOSIS — R52 Pain, unspecified: Secondary | ICD-10-CM

## 2022-07-12 DIAGNOSIS — J101 Influenza due to other identified influenza virus with other respiratory manifestations: Secondary | ICD-10-CM | POA: Diagnosis not present

## 2022-07-12 DIAGNOSIS — R051 Acute cough: Secondary | ICD-10-CM | POA: Diagnosis not present

## 2022-07-12 LAB — RESP PANEL BY RT-PCR (RSV, FLU A&B, COVID)  RVPGX2
Influenza A by PCR: NEGATIVE
Influenza B by PCR: POSITIVE — AB
Resp Syncytial Virus by PCR: NEGATIVE
SARS Coronavirus 2 by RT PCR: NEGATIVE

## 2022-07-12 MED ORDER — PROMETHAZINE-DM 6.25-15 MG/5ML PO SYRP: 5.0000 mL | ORAL_SOLUTION | Freq: Four times a day (QID) | ORAL | 0 refills | Status: AC | PRN

## 2022-07-12 MED ORDER — OSELTAMIVIR PHOSPHATE 75 MG PO CAPS: 75.0000 mg | ORAL_CAPSULE | Freq: Two times a day (BID) | ORAL | 0 refills | Status: AC

## 2022-07-12 NOTE — ED Triage Notes (Signed)
Pt c/o headache x few days, body aches, sweats, cough

## 2022-07-12 NOTE — Discharge Instructions (Signed)
-  You have the flu.  I sent Tamiflu and cough medicine to the pharmacy. - Increase rest and fluid intake.  You should be feeling better in the next week. - If you have any uncontrolled fever, weakness or breathing trouble, return. - You may go back to work when you are feeling fever free and feeling better.

## 2022-07-12 NOTE — ED Provider Notes (Signed)
[facility]    CSN: [id number] Arrival date & time: 07/12/22  1054      History   Chief Complaint Chief Complaint  Patient presents with   Headache   Cough   Chills   Generalized Body Aches    HPI Cody Aguilar is a 47 y.o. male presenting for 2-day history of headaches, body aches, dry cough, sweats, chills.  He reports that he has been around his daughter and granddaughter who have the flu.  He denies any recorded fever and has not had any congestion, sore throat, breathing difficulty, vomiting or diarrhea.  Has been taking DayQuil and NyQuil over-the-counter for symptoms.  No other complaints.  HPI  Past Medical History:  Diagnosis Date   High cholesterol     Patient Active Problem List   Diagnosis Date Noted   Subacute cough 07/02/2021   Mixed hyperlipidemia 07/24/2020   Nicotine dependence, cigarettes, uncomplicated 16/12/3708   Insomnia 10/05/2018   Essential hypertension 03/02/2018    Past Surgical History:  Procedure Laterality Date   COLONOSCOPY     hemorrhoid removal         Home Medications    Prior to Admission medications   Medication Sig Start Date End Date Taking? Authorizing Provider  amLODipine (NORVASC) 10 MG tablet Take 1 tablet (10 mg total) by mouth daily. 07/02/21  Yes Cannady, Henrine Screws T, NP  oseltamivir (TAMIFLU) 75 MG capsule Take 1 capsule (75 mg total) by mouth every 12 (twelve) hours for 5 days. 07/12/22 07/17/22 Yes Danton Clap, PA-C  promethazine-dextromethorphan (PROMETHAZINE-DM) 6.25-15 MG/5ML syrup Take 5 mLs by mouth 4 (four) times daily as needed. 07/12/22  Yes Danton Clap, PA-C  albuterol (VENTOLIN HFA) 108 (90 Base) MCG/ACT inhaler Inhale 2 puffs into the lungs every 6 (six) hours as needed for wheezing or shortness of breath. 07/02/21   Marnee Guarneri T, NP  aspirin EC 81 MG tablet Take 81 mg by mouth daily. Swallow whole.    [provider]  atorvastatin (LIPITOR) 40 MG tablet Take 1 tablet (40  mg total) by mouth daily. 07/18/20 10/16/20  Kate Sable, MD  hydrochlorothiazide (HYDRODIURIL) 25 MG tablet Take 1 tablet (25 mg total) by mouth daily. 07/02/21   Marnee Guarneri T, NP  loratadine (CLARITIN) 10 MG tablet TAKE 1 TABLET BY MOUTH EVERY DAY 12/23/21   Jon Billings, NP  predniSONE (DELTASONE) 20 MG tablet Take 60mg  PO daily x 2 days, then40mg  PO daily x 2 days, then 20mg  PO daily x 3 days 11/26/21   Mecum, Erin E, PA-C  sildenafil (VIAGRA) 100 MG tablet Take 1 tablet (100 mg total) by mouth daily as needed for erectile dysfunction. Patient not taking: Reported on 11/26/2021 09/18/20   Valerie Roys, DO    Family History Family History  Problem Relation Age of Onset   Hypertension Mother    Diabetes Mother    Heart disease Mother    Multiple sclerosis Mother    Hypertension Father    Hypertension Brother    Heart disease Maternal Grandmother    Heart disease Maternal Grandfather     Social History Social History   Tobacco Use   Smoking status: Every Day    Packs/day: 0.50    Types: Cigarettes   Smokeless tobacco: Never  Vaping Use   Vaping Use: Former  Substance Use Topics   Alcohol use: No   Drug use: No     Allergies   Oxymetazoline   Review of Systems  Review of Systems  Constitutional:  Positive for chills, diaphoresis and fatigue. Negative for fever.  HENT:  Negative for congestion, rhinorrhea, sinus pressure, sinus pain and sore throat.   Respiratory:  Positive for cough. Negative for shortness of breath.   Cardiovascular:  Negative for chest pain.  Gastrointestinal:  Negative for abdominal pain, diarrhea, nausea and vomiting.  Musculoskeletal:  Positive for myalgias.  Neurological:  Positive for headaches. Negative for weakness and light-headedness.  Hematological:  Negative for adenopathy.     Physical Exam Triage Vital Signs ED Triage Vitals  Enc Vitals Group     BP      Pulse      Resp      Temp      Temp src      SpO2       Weight      Height      Head Circumference      Peak Flow      Pain Score      Pain Loc      Pain Edu?      Excl. in GC?    No data found.  Updated Vital Signs BP (!) 122/94 (BP Location: Left Arm)   Pulse (!) 104   Temp 99.4 F (37.4 C) (Oral)   Ht 6' (1.829 m)   Wt 198 lb 13.7 oz (90.2 kg)   SpO2 98%   BMI 26.97 kg/m       Physical Exam Vitals and nursing note reviewed.  Constitutional:      General: He is not in acute distress.    Appearance: Normal appearance. He is well-developed. He is ill-appearing.  HENT:     Head: Normocephalic and atraumatic.     Nose: Nose normal.     Mouth/Throat:     Mouth: Mucous membranes are moist.     Pharynx: Oropharynx is clear. Posterior oropharyngeal erythema present.  Eyes:     General: No scleral icterus.    Conjunctiva/sclera: Conjunctivae normal.  Cardiovascular:     Rate and Rhythm: Regular rhythm. Tachycardia present.     Heart sounds: Normal heart sounds.  Pulmonary:     Effort: Pulmonary effort is normal. No respiratory distress.     Breath sounds: Normal breath sounds.  Musculoskeletal:     Cervical back: Neck supple.  Skin:    General: Skin is warm and dry.     Capillary Refill: Capillary refill takes less than 2 seconds.  Neurological:     General: No focal deficit present.     Mental Status: He is alert. Mental status is at baseline.     Motor: No weakness.     Gait: Gait normal.  Psychiatric:        Mood and Affect: Mood normal.        Behavior: Behavior normal.      UC Treatments / Results  Labs (all labs ordered are listed, but only abnormal results are displayed) Labs Reviewed  RESP PANEL BY RT-PCR (RSV, FLU A&B, COVID)  RVPGX2 - Abnormal; Notable for the following components:      Result Value   Influenza B by PCR POSITIVE (*)    All other components within normal limits    EKG   Radiology No results found.  Procedures Procedures (including critical care time)  Medications Ordered in  UC Medications - No data to display  Initial Impression / Assessment and Plan / UC Course  I have reviewed the triage vital signs and the  nursing notes.  Pertinent labs & imaging results that were available during my care of the patient were reviewed by me and considered in my medical decision making (see chart for details).   47 year old male presents for 2-day history of chills, sweats, body aches, cough.  Has been exposed to influenza.  He is currently afebrile.  Pulse little elevated 104 bpm.  He is ill-appearing but nontoxic.  On exam he does have erythema posterior pharynx.  Chest clear to auscultation.  Respiratory panel obtained.  Positive flu B.  Schedule patient.  Sent Tamiflu and Promethazine DM to pharmacy.  Reviewed supportive care, return and ER precautions.   Final Clinical Impressions(s) / UC Diagnoses   Final diagnoses:  Influenza B  Acute cough  Body aches     Discharge Instructions      -You have the flu.  I sent Tamiflu and cough medicine to the pharmacy. - Increase rest and fluid intake.  You should be feeling better in the next week. - If you have any uncontrolled fever, weakness or breathing trouble, return. - You may go back to work when you are feeling fever free and feeling better.     ED Prescriptions     Medication Sig Dispense Auth. Provider   oseltamivir (TAMIFLU) 75 MG capsule Take 1 capsule (75 mg total) by mouth every 12 (twelve) hours for 5 days. 10 capsule Danton Clap, PA-C   promethazine-dextromethorphan (PROMETHAZINE-DM) 6.25-15 MG/5ML syrup Take 5 mLs by mouth 4 (four) times daily as needed. 118 mL Danton Clap, PA-C      PDMP not reviewed this encounter.   Danton Clap, PA-C 07/12/22 1256

## 2022-11-30 DIAGNOSIS — R000 Tachycardia, unspecified: Secondary | ICD-10-CM | POA: Diagnosis not present

## 2022-11-30 DIAGNOSIS — R55 Syncope and collapse: Secondary | ICD-10-CM | POA: Diagnosis not present

## 2022-11-30 DIAGNOSIS — G4489 Other headache syndrome: Secondary | ICD-10-CM | POA: Diagnosis not present

## 2022-11-30 DIAGNOSIS — W19XXXA Unspecified fall, initial encounter: Secondary | ICD-10-CM | POA: Diagnosis not present

## 2022-12-07 IMAGING — CT CT HEART MORP W/ CTA COR W/ SCORE W/ CA W/CM &/OR W/O CM
1 of 14 series · 3 of 20 positions shown, 4 images · non-contrast
Comparison: 10/21/2017 chest radiograph.

Addendum:
CLINICAL DATA: Chest pain

EXAM:
Cardiac/Coronary  CTA
TECHNIQUE: The patient was scanned on a Siemens Somatoform go.Top scanner.

[Series 44: ms multiphase cta coronary 0.60 · axial · 0.39mm/px · z∈[-1083,-1023]mm · 3 of 2727 slices shown, 4 images]
[im 682/2727  vessel]
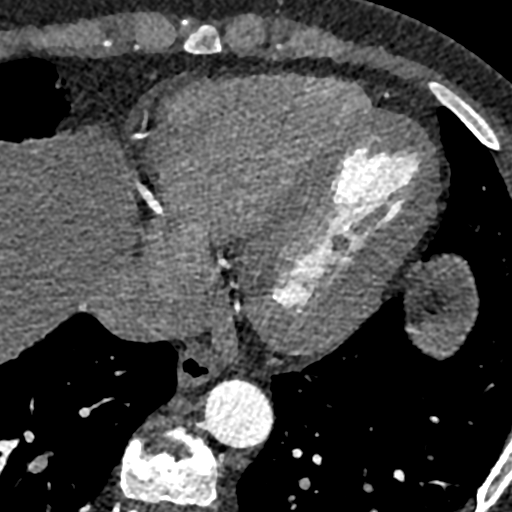
[im 682/2727  lung]
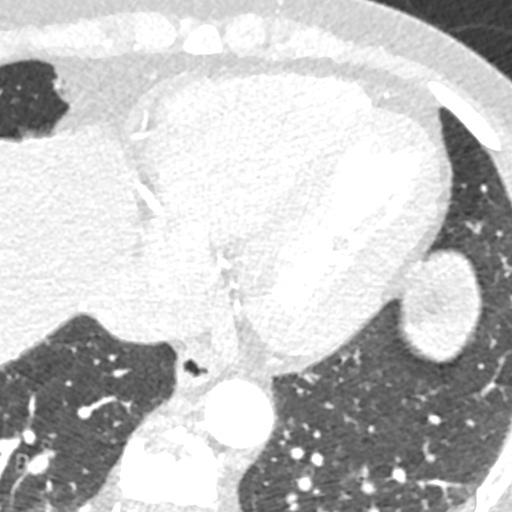
[im 1364/2727  vessel]
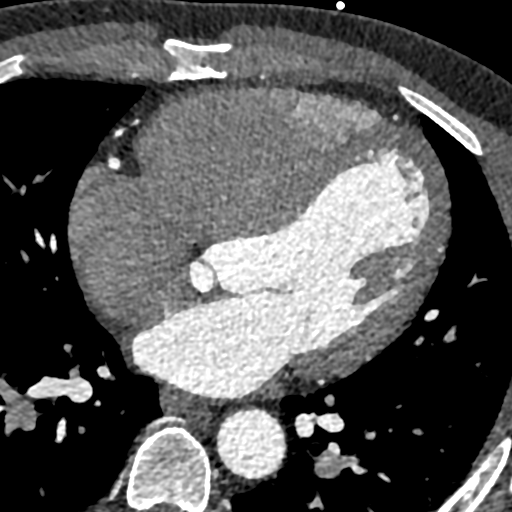
[im 2045/2727  vessel]
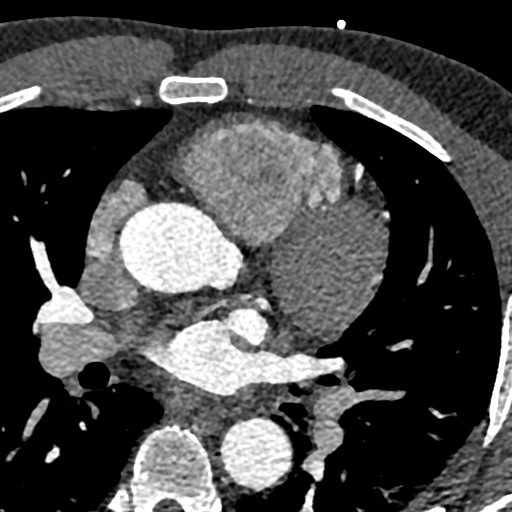

[3 of 20 positions shown; findings below may reference images not displayed]

FINDINGS: A retrospective scan was triggered in the descending thoracic aorta.
Axial non-contrast 3 mm slices were carried out through the heart.
The data set was analyzed on a dedicated work station and scored
using the Agatson method. Gantry rotation speed was 330 msecs and
collimation was .6 mm. 100mg of metoprolol and 0.8 mg of sl NTG was
given. The 3D data set was reconstructed in 5% intervals of the
60-95 % of the R-R cycle. Diastolic phases were analyzed on a
dedicated work station using MPR, MIP and VRT modes. The patient
received 75 cc of contrast.

Aorta:  Normal size.  No calcifications.  No dissection.

Aortic Valve:  Trileaflet.  No calcifications.

Coronary Arteries:  Normal coronary origin.  Right dominance.

RCA is a dominant artery that gives rise to PDA and PLA. There is no
plaque.

Left main is a large artery that gives rise to LAD and LCX arteries.

LAD has no plaque.

LCX is a non-dominant artery that gives rise to two obtuse marginal
branches. There is no plaque.

Other findings:

Normal pulmonary vein drainage into the left atrium.

Normal left atrial appendage without a thrombus.

Normal size of the pulmonary artery.
IMPRESSION: 1. Normal coronary calcium score of 0. Patient is low risk for
coronary events.

2. Normal coronary origin with right dominance.

3. No evidence of CAD.

4. CAD-RADS 0. Consider non-atherosclerotic causes of chest pain.

EXAM:
OVER-READ INTERPRETATION  CT CHEST

The following report is an over-read performed by radiologist Dr.
does not include interpretation of cardiac or coronary anatomy or
pathology. The coronary CTA interpretation by the cardiologist is
attached.
FINDINGS: Cardiovascular: Top-normal heart size. No significant pericardial
effusion/thickening. Great vessels are normal in course and caliber.
No central pulmonary emboli.

Mediastinum/Nodes: Unremarkable esophagus. No pathologically
enlarged mediastinal or hilar lymph nodes.

Lungs/Pleura: No pneumothorax. No pleural effusion. No acute
consolidative airspace disease or lung masses. Tiny 2 mm anterior
right middle lobe solid pulmonary nodule (series 21/image 20). No
additional significant pulmonary nodules.

Upper abdomen: No acute abnormality.

Musculoskeletal:  No aggressive appearing focal osseous lesions.
IMPRESSION: Tiny 2 mm right middle lobe solid pulmonary nodule. No follow-up
needed if patient is low-risk. Non-contrast chest CT can be
considered in 12 months if patient is high-risk. This recommendation
follows the consensus statement: Guidelines for Management of
Incidental Pulmonary Nodules Detected on CT Images: From the

*** End of Addendum ***
FINDINGS: A retrospective scan was triggered in the descending thoracic aorta.
Axial non-contrast 3 mm slices were carried out through the heart.
The data set was analyzed on a dedicated work station and scored
using the Agatson method. Gantry rotation speed was 330 msecs and
collimation was .6 mm. 100mg of metoprolol and 0.8 mg of sl NTG was
given. The 3D data set was reconstructed in 5% intervals of the
60-95 % of the R-R cycle. Diastolic phases were analyzed on a
dedicated work station using MPR, MIP and VRT modes. The patient
received 75 cc of contrast.

Aorta:  Normal size.  No calcifications.  No dissection.

Aortic Valve:  Trileaflet.  No calcifications.

Coronary Arteries:  Normal coronary origin.  Right dominance.

RCA is a dominant artery that gives rise to PDA and PLA. There is no
plaque.

Left main is a large artery that gives rise to LAD and LCX arteries.

LAD has no plaque.

LCX is a non-dominant artery that gives rise to two obtuse marginal
branches. There is no plaque.

Other findings:

Normal pulmonary vein drainage into the left atrium.

Normal left atrial appendage without a thrombus.

Normal size of the pulmonary artery.
IMPRESSION: 1. Normal coronary calcium score of 0. Patient is low risk for
coronary events.

2. Normal coronary origin with right dominance.

3. No evidence of CAD.

4. CAD-RADS 0. Consider non-atherosclerotic causes of chest pain.

## 2023-02-21 ENCOUNTER — Ambulatory Visit: Payer: BC Managed Care – PPO | Admitting: Family Medicine

## 2023-11-01 IMAGING — DX DG CHEST 2V
2 series · 2 of 2 positions shown · non-contrast
Comparison: 10/21/2017

CLINICAL DATA: Persistent cough and shortness of breath

EXAM:
CHEST - 2 VIEW

[chest pa]
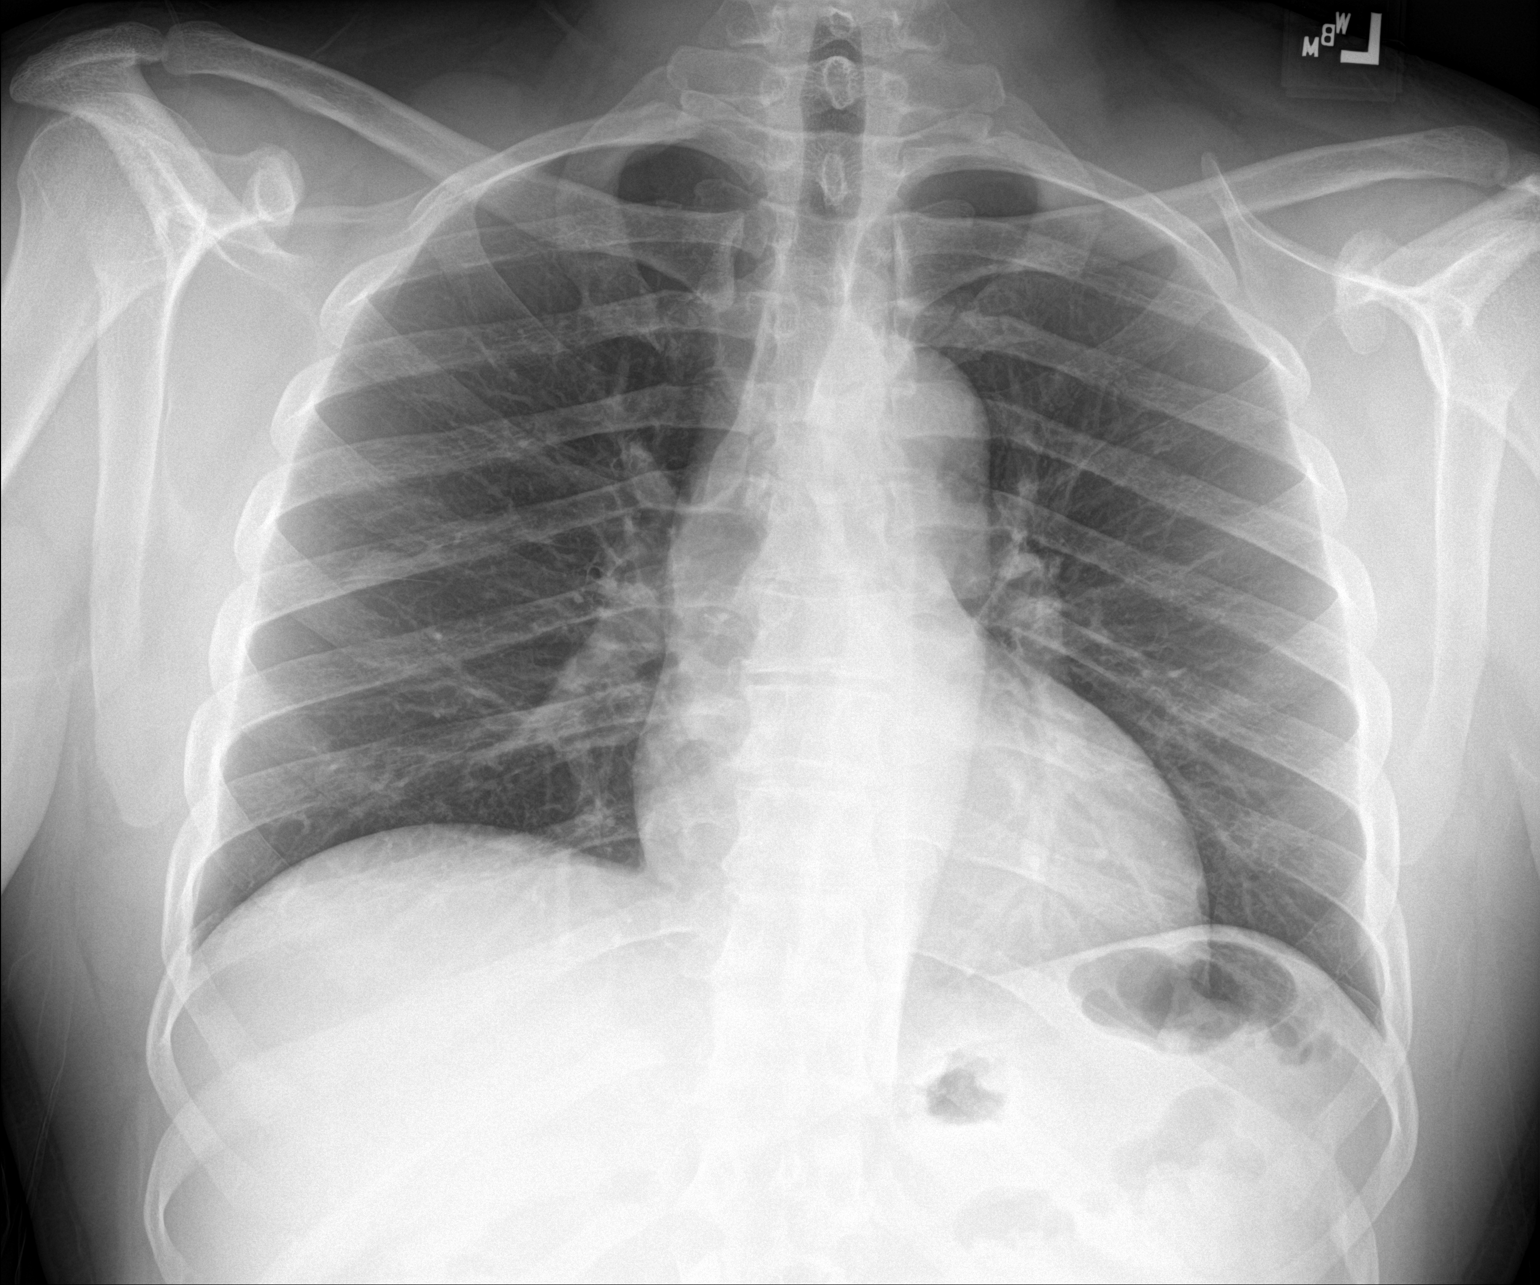

[chest lat]
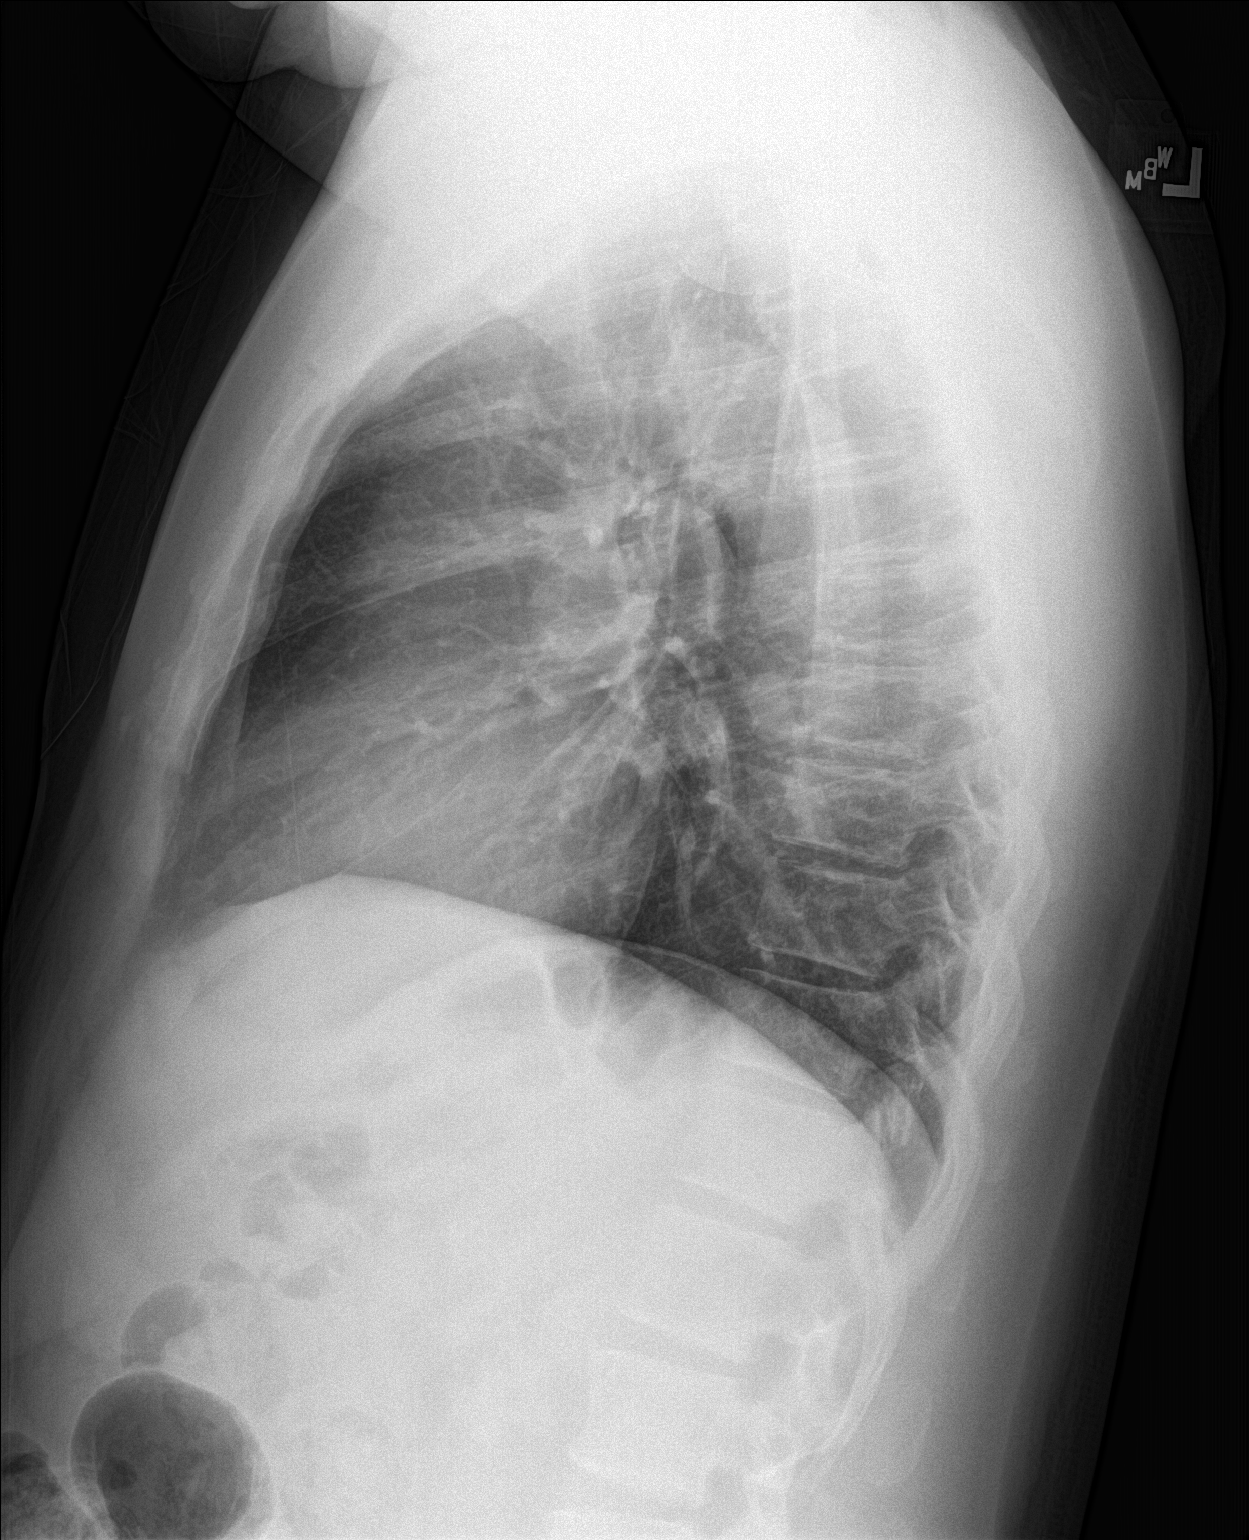

[2 of 2 positions shown; findings below may reference images not displayed]

FINDINGS: Normal heart size and vascularity. Lungs are clear. Negative for
edema, effusion or pneumothorax. Trachea midline. Stable sclerotic
lesion in the right posterior sixth rib, suspect bone island (dating
back to 06/16/2011). No acute osseous finding.
IMPRESSION: No active cardiopulmonary disease.
